# Patient Record
Sex: Female | Born: 1970 | Race: Black or African American | Hispanic: No | Marital: Single | Smoking: Former smoker
Health system: Southern US, Community
[De-identification: ages and names within clinical notes are randomized; demographics above are authoritative.]

## PROBLEM LIST (undated history)

## (undated) HISTORY — PX: HERNIA REPAIR: SHX51

## (undated) HISTORY — PX: MOUTH SURGERY: SHX715

## (undated) HISTORY — PX: UPPER GASTROINTESTINAL ENDOSCOPY: SHX188

---

## 1997-07-06 ENCOUNTER — Ambulatory Visit (HOSPITAL_COMMUNITY): Admission: RE | Admit: 1997-07-06 | Discharge: 1997-07-06 | Payer: Self-pay | Admitting: *Deleted

## 1997-08-14 ENCOUNTER — Inpatient Hospital Stay (HOSPITAL_COMMUNITY): Admission: AD | Admit: 1997-08-14 | Discharge: 1997-08-14 | Payer: Self-pay | Admitting: *Deleted

## 1997-09-06 ENCOUNTER — Encounter: Admission: RE | Admit: 1997-09-06 | Discharge: 1997-09-06 | Payer: Self-pay | Admitting: Sports Medicine

## 1997-09-12 ENCOUNTER — Encounter: Admission: RE | Admit: 1997-09-12 | Discharge: 1997-09-12 | Payer: Self-pay | Admitting: Family Medicine

## 1997-09-20 ENCOUNTER — Encounter: Admission: RE | Admit: 1997-09-20 | Discharge: 1997-09-20 | Payer: Self-pay | Admitting: Family Medicine

## 1997-09-27 ENCOUNTER — Encounter: Admission: RE | Admit: 1997-09-27 | Discharge: 1997-09-27 | Payer: Self-pay | Admitting: Sports Medicine

## 1997-09-27 ENCOUNTER — Inpatient Hospital Stay (HOSPITAL_COMMUNITY): Admission: AD | Admit: 1997-09-27 | Discharge: 1997-09-28 | Payer: Self-pay | Admitting: Obstetrics

## 1997-11-07 ENCOUNTER — Encounter: Admission: RE | Admit: 1997-11-07 | Discharge: 1997-11-07 | Payer: Self-pay | Admitting: Family Medicine

## 1998-12-24 ENCOUNTER — Encounter: Admission: RE | Admit: 1998-12-24 | Discharge: 1998-12-24 | Payer: Self-pay | Admitting: Family Medicine

## 1999-01-30 ENCOUNTER — Encounter: Admission: RE | Admit: 1999-01-30 | Discharge: 1999-01-30 | Payer: Self-pay | Admitting: Family Medicine

## 1999-07-29 ENCOUNTER — Encounter: Admission: RE | Admit: 1999-07-29 | Discharge: 1999-07-29 | Payer: Self-pay | Admitting: Sports Medicine

## 2000-03-11 ENCOUNTER — Other Ambulatory Visit: Admission: RE | Admit: 2000-03-11 | Discharge: 2000-03-11 | Payer: Self-pay | Admitting: *Deleted

## 2000-03-11 ENCOUNTER — Encounter: Admission: RE | Admit: 2000-03-11 | Discharge: 2000-03-11 | Payer: Self-pay | Admitting: Family Medicine

## 2000-06-28 ENCOUNTER — Emergency Department (HOSPITAL_COMMUNITY): Admission: EM | Admit: 2000-06-28 | Discharge: 2000-06-28 | Payer: Self-pay | Admitting: Emergency Medicine

## 2000-07-12 ENCOUNTER — Encounter: Payer: Self-pay | Admitting: Oral Surgery

## 2000-07-12 ENCOUNTER — Inpatient Hospital Stay (HOSPITAL_COMMUNITY): Admission: EM | Admit: 2000-07-12 | Discharge: 2000-07-14 | Payer: Self-pay | Admitting: *Deleted

## 2001-04-27 ENCOUNTER — Encounter: Admission: RE | Admit: 2001-04-27 | Discharge: 2001-04-27 | Payer: Self-pay | Admitting: Family Medicine

## 2001-09-29 ENCOUNTER — Encounter: Admission: RE | Admit: 2001-09-29 | Discharge: 2001-09-29 | Payer: Self-pay | Admitting: Family Medicine

## 2001-09-29 ENCOUNTER — Other Ambulatory Visit: Admission: RE | Admit: 2001-09-29 | Discharge: 2001-09-29 | Payer: Self-pay | Admitting: Family Medicine

## 2003-09-21 ENCOUNTER — Emergency Department (HOSPITAL_COMMUNITY): Admission: EM | Admit: 2003-09-21 | Discharge: 2003-09-21 | Payer: Self-pay | Admitting: Emergency Medicine

## 2004-05-28 ENCOUNTER — Other Ambulatory Visit: Admission: RE | Admit: 2004-05-28 | Discharge: 2004-05-28 | Payer: Self-pay | Admitting: Family Medicine

## 2004-05-28 ENCOUNTER — Ambulatory Visit: Payer: Self-pay | Admitting: Sports Medicine

## 2004-07-18 ENCOUNTER — Emergency Department (HOSPITAL_COMMUNITY): Admission: EM | Admit: 2004-07-18 | Discharge: 2004-07-18 | Payer: Self-pay | Admitting: Emergency Medicine

## 2004-07-23 ENCOUNTER — Ambulatory Visit: Payer: Self-pay | Admitting: Family Medicine

## 2004-07-23 ENCOUNTER — Encounter: Admission: RE | Admit: 2004-07-23 | Discharge: 2004-07-23 | Payer: Self-pay | Admitting: Sports Medicine

## 2004-07-28 ENCOUNTER — Ambulatory Visit: Payer: Self-pay | Admitting: Sports Medicine

## 2004-12-30 ENCOUNTER — Encounter (INDEPENDENT_AMBULATORY_CARE_PROVIDER_SITE_OTHER): Payer: Self-pay | Admitting: *Deleted

## 2004-12-30 LAB — CONVERTED CEMR LAB

## 2005-01-01 ENCOUNTER — Other Ambulatory Visit: Admission: RE | Admit: 2005-01-01 | Discharge: 2005-01-01 | Payer: Self-pay | Admitting: Family Medicine

## 2005-01-01 ENCOUNTER — Ambulatory Visit: Payer: Self-pay | Admitting: Sports Medicine

## 2006-07-29 DIAGNOSIS — D179 Benign lipomatous neoplasm, unspecified: Secondary | ICD-10-CM | POA: Insufficient documentation

## 2006-07-30 ENCOUNTER — Encounter (INDEPENDENT_AMBULATORY_CARE_PROVIDER_SITE_OTHER): Payer: Self-pay | Admitting: *Deleted

## 2007-11-30 ENCOUNTER — Emergency Department (HOSPITAL_COMMUNITY): Admission: EM | Admit: 2007-11-30 | Discharge: 2007-11-30 | Payer: Self-pay | Admitting: Physician Assistant

## 2008-07-31 ENCOUNTER — Ambulatory Visit (HOSPITAL_COMMUNITY): Admission: RE | Admit: 2008-07-31 | Discharge: 2008-07-31 | Payer: Self-pay | Admitting: Obstetrics

## 2009-06-23 ENCOUNTER — Emergency Department (HOSPITAL_COMMUNITY): Admission: EM | Admit: 2009-06-23 | Discharge: 2009-06-23 | Payer: Self-pay | Admitting: Emergency Medicine

## 2010-01-20 ENCOUNTER — Ambulatory Visit (HOSPITAL_COMMUNITY): Admission: RE | Admit: 2010-01-20 | Discharge: 2010-01-20 | Payer: Self-pay | Admitting: Obstetrics

## 2010-06-22 ENCOUNTER — Encounter: Payer: Self-pay | Admitting: Obstetrics

## 2010-10-17 NOTE — Op Note (Signed)
[facility]  Patient:    Kayla Maldonado, Kayla Maldonado                         MRN: [id number] Proc. Date: 07/13/00 Adm. 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  OPERATION:  Extraction of multiple teeth, debridement of oral abscesses.  SURGEON:  Lovena Le, D.D.S.  ASSISTANT:  ANESTHESIA:  General anesthesia via nasotracheal intubation.  INDICATIONS:  Ms. Hedtke presented to [facility] through the emergency department early in the a.m. of July 12, 2000, complaining of oral swelling and pain.  Upon examination, she exhibited generalized purulent drainage about the dentition and generalized oral swelling. Consequently, oral debridement and extractions were necessary as well as IV antibiotics due to a fever of 102 F.  DESCRIPTION OF PROCEDURE:  The patient was brought to the operating room in stable preoperative condition and placed on the operating room table in the supine position.  Following successful induction and nasotracheal intubation, the patient was prepped and draped in the usual sterile fashion for a procedure of this type.  Initially the oral cavity was thoroughly irrigated and prepped.  An oropharyngeal throat pack was placed.  Approximately 12 cc of a 1% lidocaine solution with 1:100,000 epinephrine was infiltrated into the maxillary and mandibular buccal soft tissues. Next, teeth numbers 7, 8, 9, 10, 19, 22, 23, 24, 25, 26 and 27 were all surgically removed.  The associated soft tissues were debrided. The two sockets were curetted and the operative sites were then thoroughly irrigated and closed with 4-0 chromic gut sutures. This completed the procedure and the oropharyngeal  throat pack was removed and the patient was allowed to recover from general anesthesia. The patient was then transported to the post anesthesia care unit in satisfactory postoperative condition. DD:  07/13/00 TD:  07/13/00 Job: [id number]

## 2010-10-17 NOTE — Op Note (Signed)
[facility]  Patient:    Kayla Maldonado, Kayla Maldonado                         MRN: [id number] Proc. Date: 07/13/00 Adm. 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  POSTOPERATIVE DIAGNOSIS:  Generalized severe periodontal disease with  multiple periodontal abscesses.  PROCEDURE: 1. Extraction of multiple teeth. 2. Debridement of oral abscesses.  ANESTHESIA: General via nasal tracheal intubation.  SURGEON:  Lovena Le, D.D.S.  INDICATIONS:  [name] presented to [facility] through the emergency department early in the a.m. of July 12, 2000, complaining of oral swelling and pain.  Upon examination she exhibited generalized purulent drainage about the dentition and generalized oral swelling.  Consequently an oral debridement and extractions were necessary as well as DD:  07/13/00 TD:  07/13/00 Job: [id number]

## 2011-04-06 ENCOUNTER — Ambulatory Visit: Payer: Self-pay | Admitting: Family

## 2011-04-06 DIAGNOSIS — Z0289 Encounter for other administrative examinations: Secondary | ICD-10-CM

## 2011-07-02 ENCOUNTER — Other Ambulatory Visit: Payer: Self-pay | Admitting: Internal Medicine

## 2011-07-02 DIAGNOSIS — R1013 Epigastric pain: Secondary | ICD-10-CM

## 2011-07-07 ENCOUNTER — Ambulatory Visit
Admission: RE | Admit: 2011-07-07 | Discharge: 2011-07-07 | Disposition: A | Discharge status: Home / Self Care | Payer: BC Managed Care – PPO | Source: Ambulatory Visit | Attending: Internal Medicine | Admitting: Internal Medicine

## 2011-07-07 DIAGNOSIS — R1013 Epigastric pain: Secondary | ICD-10-CM

## 2011-11-10 ENCOUNTER — Other Ambulatory Visit: Payer: Self-pay | Admitting: Internal Medicine

## 2011-11-10 DIAGNOSIS — Z1231 Encounter for screening mammogram for malignant neoplasm of breast: Secondary | ICD-10-CM

## 2011-11-26 ENCOUNTER — Ambulatory Visit: Payer: BC Managed Care – PPO

## 2011-12-24 ENCOUNTER — Ambulatory Visit
Admission: RE | Admit: 2011-12-24 | Discharge: 2011-12-24 | Disposition: A | Discharge status: Home / Self Care | Payer: BC Managed Care – PPO | Source: Ambulatory Visit | Attending: Internal Medicine | Admitting: Internal Medicine

## 2011-12-24 DIAGNOSIS — Z1231 Encounter for screening mammogram for malignant neoplasm of breast: Secondary | ICD-10-CM

## 2012-06-05 ENCOUNTER — Encounter (HOSPITAL_BASED_OUTPATIENT_CLINIC_OR_DEPARTMENT_OTHER): Payer: Self-pay | Admitting: Emergency Medicine

## 2012-06-05 ENCOUNTER — Emergency Department (HOSPITAL_BASED_OUTPATIENT_CLINIC_OR_DEPARTMENT_OTHER)
Admission: EM | Admit: 2012-06-05 | Discharge: 2012-06-05 | Disposition: A | Discharge status: Home / Self Care | Payer: BC Managed Care – PPO | Attending: Emergency Medicine | Admitting: Emergency Medicine

## 2012-06-05 ENCOUNTER — Emergency Department (HOSPITAL_BASED_OUTPATIENT_CLINIC_OR_DEPARTMENT_OTHER): Payer: BC Managed Care – PPO

## 2012-06-05 DIAGNOSIS — B9789 Other viral agents as the cause of diseases classified elsewhere: Secondary | ICD-10-CM | POA: Insufficient documentation

## 2012-06-05 DIAGNOSIS — J3489 Other specified disorders of nose and nasal sinuses: Secondary | ICD-10-CM | POA: Insufficient documentation

## 2012-06-05 DIAGNOSIS — R059 Cough, unspecified: Secondary | ICD-10-CM | POA: Insufficient documentation

## 2012-06-05 DIAGNOSIS — R197 Diarrhea, unspecified: Secondary | ICD-10-CM | POA: Insufficient documentation

## 2012-06-05 DIAGNOSIS — R5381 Other malaise: Secondary | ICD-10-CM | POA: Insufficient documentation

## 2012-06-05 DIAGNOSIS — R52 Pain, unspecified: Secondary | ICD-10-CM | POA: Insufficient documentation

## 2012-06-05 DIAGNOSIS — R05 Cough: Secondary | ICD-10-CM | POA: Insufficient documentation

## 2012-06-05 DIAGNOSIS — B349 Viral infection, unspecified: Secondary | ICD-10-CM

## 2012-06-05 DIAGNOSIS — IMO0001 Reserved for inherently not codable concepts without codable children: Secondary | ICD-10-CM | POA: Insufficient documentation

## 2012-06-05 MED ORDER — IBUPROFEN 800 MG PO TABS
800.0000 mg | ORAL_TABLET | Freq: Once | ORAL | Status: AC
  Administered 2012-06-05: 800 mg via ORAL
  Filled 2012-06-05: qty 1

## 2012-06-05 NOTE — ED Notes (Addendum)
Mild productive cough since yesterday.  Generalized body aches, chills, fever.  Some diarrhea, no n/v. Hasn't taken any Tylenol or Motrin for fever/sx.  No known exposure to anyone with similar sx.  She has not had a flu vaccine this year.

## 2012-06-05 NOTE — ED Provider Notes (Signed)
History     CSN: [id number]  Arrival date & time 06/05/12  4098   First MD Initiated Contact with Patient 06/05/12 [phone]      Chief Complaint  Patient presents with  . Cough  . Fever  . Generalized Body Aches    (Consider location/radiation/quality/duration/timing/severity/associated sxs/prior treatment) HPI Comments: Patient presents with a two-day history of flulike symptoms. She states she's had a sore throat and some mild nasal congestion. She also feels congested in her chest and has had a slightly productive cough. She sore all over including sore across her chest and her abdomen from coughing. She is also sore in her extremities. She denies any neck pain or stiffness. Denies any photophobia. She has a mild headache. She's had some loose stools but no vomiting or diarrhea. She's had fevers but has not taken anything at home for fever.  Patient is a 42 y.o. female presenting with cough and fever.  Cough Associated symptoms include rhinorrhea and myalgias. Pertinent negatives include no chest pain, no chills, no headaches and no shortness of breath.  Fever Primary symptoms of the febrile illness include fever, fatigue, cough, diarrhea and myalgias. Primary symptoms do not include headaches, shortness of breath, abdominal pain, nausea, vomiting, arthralgias or rash.  The myalgias are not associated with weakness.    History reviewed. No pertinent past medical history.  History reviewed. No pertinent past surgical history.  No family history on file.  History  Substance Use Topics  . Smoking status: Never Smoker   . Smokeless tobacco: Not on file  . Alcohol Use: Yes     Comment: occasionally    OB History    Grav Para Term Preterm Abortions TAB SAB Ect Mult Living                  Review of Systems  Constitutional: Positive for fever and fatigue. Negative for chills and diaphoresis.  HENT: Positive for congestion and rhinorrhea. Negative for sneezing.   Eyes:  Negative.   Respiratory: Positive for cough. Negative for chest tightness and shortness of breath.   Cardiovascular: Negative for chest pain and leg swelling.  Gastrointestinal: Positive for diarrhea. Negative for nausea, vomiting, abdominal pain and blood in stool.  Genitourinary: Negative for frequency, hematuria, flank pain and difficulty urinating.  Musculoskeletal: Positive for myalgias. Negative for back pain and arthralgias.  Skin: Negative for rash.  Neurological: Negative for dizziness, speech difficulty, weakness, numbness and headaches.    Allergies  Codeine  Home Medications  No current outpatient prescriptions on file.  BP 121/92  Pulse 109  Temp 101 F (38.3 C) (Oral)  Resp 18  Ht 5\' 5"  (1.651 m)  Wt 140 lb (63.504 kg)  BMI 23.30 kg/m2  SpO2 100%  LMP 06/05/2012  Physical Exam  Constitutional: She is oriented to person, place, and time. She appears well-developed and well-nourished.  HENT:  Head: Normocephalic and atraumatic.  Right Ear: External ear normal.  Left Ear: External ear normal.  Mouth/Throat: Oropharynx is clear and moist.  Eyes: Pupils are equal, round, and reactive to light.  Neck: Normal range of motion. Neck supple.       No meningeal signs  Cardiovascular: Normal rate, regular rhythm and normal heart sounds.   Pulmonary/Chest: Effort normal and breath sounds normal. No respiratory distress. She has no wheezes. She has no rales. She exhibits no tenderness.  Abdominal: Soft. Bowel sounds are normal. There is no tenderness. There is no rebound and no guarding.  Musculoskeletal: Normal  range of motion. She exhibits no edema.  Lymphadenopathy:    She has no cervical adenopathy.  Neurological: She is alert and oriented to person, place, and time.  Skin: Skin is warm and dry. No rash noted.  Psychiatric: She has a normal mood and affect.    ED Course  Procedures (including critical care time)  Results for orders placed during the hospital  encounter of 06/05/12  RAPID STREP SCREEN      Component Value Range   Streptococcus, Group A Screen (Direct) NEGATIVE  NEGATIVE   Dg Chest 2 View  06/05/2012  *RADIOLOGY REPORT*  Clinical Data: 42 year old with cough and fever.  CHEST - 2 VIEW  Comparison: 09/21/2003  Findings: Two views of the chest demonstrate clear lungs. Heart and mediastinum are within normal limits.  The trachea is midline.  The bony thorax is intact.  IMPRESSION: No acute cardiopulmonary disease.   Original Report Authenticated By: Richarda Overlie, M.D.       1. Viral syndrome       MDM  Patient is well-appearing with a febrile illness and upper respiratory symptoms. This is likely an upper respiratory virus. She has nothing to suggest meningitis. She did not have an ongoing cough. There is no evidence of pneumonia. Her strep test is negative. Advised her to use Motrin or Tylenol for symptomatic relief and follow up with her primary care physician or return here as needed for any ongoing or worsening symptoms.        Rolan Bucco, MD 06/05/12 1140

## 2012-06-05 NOTE — ED Notes (Signed)
Also c/o bilateral flank pain since this am.  Describes as sharp.  Right flank pain radiates to right lower rib area.

## 2012-09-05 ENCOUNTER — Ambulatory Visit: Payer: Self-pay | Admitting: Obstetrics

## 2012-09-05 ENCOUNTER — Ambulatory Visit (INDEPENDENT_AMBULATORY_CARE_PROVIDER_SITE_OTHER): Payer: BC Managed Care – PPO | Admitting: Obstetrics

## 2012-09-05 ENCOUNTER — Encounter: Payer: Self-pay | Admitting: Obstetrics

## 2012-09-05 VITALS — BP 134/87 | HR 81 | Temp 98.6°F | Ht 65.0 in | Wt 150.0 lb

## 2012-09-05 DIAGNOSIS — Z113 Encounter for screening for infections with a predominantly sexual mode of transmission: Secondary | ICD-10-CM

## 2012-09-05 DIAGNOSIS — Z01419 Encounter for gynecological examination (general) (routine) without abnormal findings: Secondary | ICD-10-CM

## 2012-09-05 NOTE — Progress Notes (Signed)
Subjective:     Kayla Maldonado is a 42 y.o. female here for a routine exam.  Current complaints: annual exam- no problems noted.   Gynecologic History Patient's last menstrual period was 08/26/2012. Contraception: condoms Last Pap: 1 year. Results were: normal Last [facility]: [facility] Results were: normal  Obstetric History OB History   Grav Para Term Preterm Abortions TAB SAB Ect Mult Living                   The following portions of the patient's history were reviewed and updated as appropriate: allergies, current medications, past family history, past medical history, past social history, past surgical history and problem list.  Review of Systems A comprehensive review of systems was negative.    Objective:    General appearance: alert and no distress    Breasts:  No masses, tenderness or nipple discharge.  Abdomen:  Soft, NT.  Pelvic:  NEFG.  Vagina, cervix, uterus and adnexa normal.  Assessment:    Healthy female exam.    Plan:    Follow up in: 1 year.

## 2012-09-05 NOTE — Addendum Note (Signed)
Addended by: Julaine Hua on: 09/05/2012 03:59 PM   Modules accepted: Orders

## 2012-09-06 LAB — GC/CHLAMYDIA PROBE AMP: CT Probe RNA: NEGATIVE

## 2012-09-07 LAB — PAP IG W/ RFLX HPV ASCU

## 2012-09-08 ENCOUNTER — Other Ambulatory Visit: Payer: BC Managed Care – PPO | Admitting: *Deleted

## 2012-09-08 LAB — LIPID PANEL
HDL: 82 mg/dL (ref >39)
LDL Cholesterol: 67 mg/dL (ref 0–99)

## 2012-09-08 LAB — CBC
HCT: 36.8 % (ref 36.0–46.0)
Platelets: 333 10*3/uL (ref 150–400)
RBC: 4.45 MIL/uL (ref 3.87–5.11)
RDW: 14.4 % (ref 11.5–15.5)
WBC: 4.4 10*3/uL (ref 4.0–10.5)

## 2012-09-08 LAB — COMPREHENSIVE METABOLIC PANEL
ALT: 12 U/L (ref 0–35)
AST: 17 U/L (ref 0–37)
Albumin: 4.2 g/dL (ref 3.5–5.2)
Calcium: 9 mg/dL (ref 8.4–10.5)
Chloride: 104 mEq/L (ref 96–112)
Potassium: 4.4 mEq/L (ref 3.5–5.3)

## 2012-10-06 ENCOUNTER — Ambulatory Visit: Payer: Self-pay | Admitting: Obstetrics

## 2013-01-10 ENCOUNTER — Other Ambulatory Visit: Payer: Self-pay

## 2013-01-10 DIAGNOSIS — Z1231 Encounter for screening mammogram for malignant neoplasm of breast: Secondary | ICD-10-CM

## 2013-01-19 ENCOUNTER — Ambulatory Visit: Payer: BC Managed Care – PPO

## 2013-01-20 ENCOUNTER — Other Ambulatory Visit: Payer: Self-pay | Admitting: *Deleted

## 2013-01-20 MED ORDER — FLUCONAZOLE 150 MG PO TABS: 150.0000 mg | ORAL_TABLET | Freq: Once | ORAL | Status: DC

## 2013-01-20 MED ORDER — METRONIDAZOLE 500 MG PO TABS: 500.0000 mg | ORAL_TABLET | Freq: Two times a day (BID) | ORAL | Status: DC

## 2013-01-24 ENCOUNTER — Encounter: Payer: Self-pay | Admitting: Obstetrics & Gynecology

## 2013-02-03 ENCOUNTER — Ambulatory Visit
Admission: RE | Admit: 2013-02-03 | Discharge: 2013-02-03 | Disposition: A | Discharge status: Home / Self Care | Payer: BC Managed Care – PPO | Source: Ambulatory Visit

## 2013-02-03 DIAGNOSIS — Z1231 Encounter for screening mammogram for malignant neoplasm of breast: Secondary | ICD-10-CM

## 2013-04-06 ENCOUNTER — Other Ambulatory Visit: Payer: Self-pay

## 2013-10-12 ENCOUNTER — Encounter: Payer: Self-pay | Admitting: Obstetrics

## 2013-10-12 ENCOUNTER — Ambulatory Visit (INDEPENDENT_AMBULATORY_CARE_PROVIDER_SITE_OTHER): Payer: BC Managed Care – PPO | Admitting: Obstetrics

## 2013-10-12 VITALS — BP 127/84 | HR 81 | Temp 98.6°F | Ht 65.0 in

## 2013-10-12 DIAGNOSIS — Z01419 Encounter for gynecological examination (general) (routine) without abnormal findings: Secondary | ICD-10-CM

## 2013-10-12 DIAGNOSIS — Z113 Encounter for screening for infections with a predominantly sexual mode of transmission: Secondary | ICD-10-CM

## 2013-10-12 LAB — RPR

## 2013-10-12 NOTE — Addendum Note (Signed)
Addended by: Lewie Loron D on: 10/12/2013 03:32 PM   Modules accepted: Orders

## 2013-10-12 NOTE — Progress Notes (Signed)
Subjective:     Kayla Maldonado is a 43 y.o. female here for a routine exam.  Current complaints: None.    Personal health questionnaire:  Is patient Kayla Maldonado, have a family history of breast and/or ovarian cancer: no Is there a family history of uterine cancer diagnosed at age < 6, gastrointestinal cancer, urinary tract cancer, family member who is a Field seismologist syndrome-associated carrier: no Is the patient overweight and hypertensive, family history of diabetes, personal history of gestational diabetes or PCOS: no Is patient over 60, have PCOS,  family history of premature CHD under age 63, diabetes, smoke, have hypertension or peripheral artery disease:  no  The HPI was reviewed and explored in further detail by the provider. Gynecologic History Patient's last menstrual period was 10/03/2013. Contraception: none Last Pap: 2014. Results were: ASCUS Last mammogram: 2014. Results were: normal  Obstetric History OB History  Gravida Para Term Preterm AB SAB TAB Ectopic Multiple Living  4 4 4       4     # Outcome Date GA Lbr Len/2nd Weight Sex Delivery Anes PTL Lv  4 TRM 09/27/97    F SVD   Y  3 TRM 11/12/93    M SVD   Y  2 TRM 03/13/92    F SVD   Y  1 TRM 10/27/90    M SVD   Y      History reviewed. No pertinent past medical history.  History reviewed. No pertinent past surgical history.  No current outpatient prescriptions on file. Allergies  Allergen Reactions  . Codeine Itching    History  Substance Use Topics  . Smoking status: Former Smoker    Quit date: 06/01/1997  . Smokeless tobacco: Not on file  . Alcohol Use: Yes     Comment: occasionally    Family History  Problem Relation Age of Onset  . Diabetes Brother       Review of Systems  Constitutional: negative for fatigue and weight loss Respiratory: negative for cough and wheezing Cardiovascular: negative for chest pain, fatigue and palpitations Gastrointestinal: negative for abdominal pain and change in  bowel habits Musculoskeletal:negative for myalgias Neurological: negative for gait problems and tremors Behavioral/Psych: negative for abusive relationship, depression Endocrine: negative for temperature intolerance   Genitourinary:negative for abnormal menstrual periods, genital lesions, hot flashes, sexual problems and vaginal discharge Integument/breast: negative for breast lump, breast tenderness, nipple discharge and skin lesion(s)    Objective:       BP 127/84  Pulse 81  Temp(Src) 98.6 F (37 C)  Ht 5\' 5"  (1.651 m)  LMP 10/03/2013 General:   alert  Skin:   no rash or abnormalities  Lungs:   clear to auscultation bilaterally  Heart:   regular rate and rhythm, S1, S2 normal, no murmur, click, rub or gallop  Breasts:   normal without suspicious masses, skin or nipple changes or axillary nodes  Abdomen:  normal findings: no organomegaly, soft, non-tender and no hernia  Pelvis:  External genitalia: normal general appearance Urinary system: urethral meatus normal and bladder without fullness, nontender Vaginal: normal without tenderness, induration or masses Cervix: normal appearance Adnexa: normal bimanual exam Uterus: anteverted and non-tender, normal size   Lab Review Urine pregnancy test Labs reviewed yes Radiologic studies reviewed no    Assessment:    Healthy female exam.    Plan:    Education reviewed: calcium supplements, low fat, low cholesterol diet, safe sex/STD prevention, self breast exams and weight bearing exercise. Contraception:  none. Follow up in: 1 year.   No orders of the defined types were placed in this encounter.   Orders Placed This Encounter  Procedures  . HIV antibody  . Hepatitis B surface antigen  . RPR  . Hepatitis C antibody

## 2013-10-13 LAB — WET PREP BY MOLECULAR PROBE
CANDIDA SPECIES: NEGATIVE
GARDNERELLA VAGINALIS: NEGATIVE
TRICHOMONAS VAG: NEGATIVE

## 2013-10-13 LAB — PAP IG W/ RFLX HPV ASCU

## 2013-10-13 LAB — HIV ANTIBODY (ROUTINE TESTING W REFLEX): HIV: NONREACTIVE

## 2013-10-13 LAB — GC/CHLAMYDIA PROBE AMP
CT Probe RNA: NEGATIVE
GC PROBE AMP APTIMA: NEGATIVE

## 2013-10-13 LAB — HEPATITIS B SURFACE ANTIGEN: HEP B S AG: NEGATIVE

## 2013-10-13 LAB — HEPATITIS C ANTIBODY: HCV AB: NEGATIVE

## 2014-02-12 ENCOUNTER — Other Ambulatory Visit: Payer: Self-pay

## 2014-02-12 DIAGNOSIS — Z1231 Encounter for screening mammogram for malignant neoplasm of breast: Secondary | ICD-10-CM

## 2014-02-21 ENCOUNTER — Ambulatory Visit
Admission: RE | Admit: 2014-02-21 | Discharge: 2014-02-21 | Disposition: A | Discharge status: Home / Self Care | Payer: BC Managed Care – PPO | Source: Ambulatory Visit

## 2014-02-21 DIAGNOSIS — Z1231 Encounter for screening mammogram for malignant neoplasm of breast: Secondary | ICD-10-CM

## 2014-03-16 ENCOUNTER — Other Ambulatory Visit: Payer: Self-pay

## 2014-04-02 ENCOUNTER — Encounter: Payer: Self-pay | Admitting: Obstetrics

## 2014-12-12 ENCOUNTER — Ambulatory Visit: Payer: BC Managed Care – PPO | Admitting: Obstetrics

## 2014-12-24 ENCOUNTER — Ambulatory Visit: Payer: BC Managed Care – PPO | Admitting: Obstetrics

## 2015-02-13 ENCOUNTER — Ambulatory Visit (INDEPENDENT_AMBULATORY_CARE_PROVIDER_SITE_OTHER): Payer: BC Managed Care – PPO | Admitting: Obstetrics

## 2015-02-13 ENCOUNTER — Encounter: Payer: Self-pay | Admitting: Obstetrics

## 2015-02-13 VITALS — BP 130/86 | HR 70 | Temp 98.4°F | Ht 65.0 in | Wt 162.0 lb

## 2015-02-13 DIAGNOSIS — N841 Polyp of cervix uteri: Secondary | ICD-10-CM

## 2015-02-13 DIAGNOSIS — Z01419 Encounter for gynecological examination (general) (routine) without abnormal findings: Secondary | ICD-10-CM

## 2015-02-13 DIAGNOSIS — Z1239 Encounter for other screening for malignant neoplasm of breast: Secondary | ICD-10-CM

## 2015-02-13 DIAGNOSIS — N930 Postcoital and contact bleeding: Secondary | ICD-10-CM

## 2015-02-13 DIAGNOSIS — Z113 Encounter for screening for infections with a predominantly sexual mode of transmission: Secondary | ICD-10-CM

## 2015-02-13 LAB — COMPREHENSIVE METABOLIC PANEL
ALK PHOS: 35 U/L (ref 33–115)
ALT: 10 U/L (ref 6–29)
AST: 15 U/L (ref 10–30)
Albumin: 4.1 g/dL (ref 3.6–5.1)
BILIRUBIN TOTAL: 0.6 mg/dL (ref 0.2–1.2)
BUN: 8 mg/dL (ref 7–25)
CO2: 24 mmol/L (ref 20–31)
CREATININE: 0.71 mg/dL (ref 0.50–1.10)
Calcium: 9.5 mg/dL (ref 8.6–10.2)
Chloride: 104 mmol/L (ref 98–110)
GLUCOSE: 73 mg/dL (ref 65–99)
Potassium: 4.6 mmol/L (ref 3.5–5.3)
SODIUM: 136 mmol/L (ref 135–146)
Total Protein: 6.8 g/dL (ref 6.1–8.1)

## 2015-02-13 LAB — CBC
HEMATOCRIT: 37.9 % (ref 36.0–46.0)
Hemoglobin: 12.7 g/dL (ref 12.0–15.0)
MCH: 27.5 pg (ref 26.0–34.0)
MCHC: 33.5 g/dL (ref 30.0–36.0)
MCV: 82 fL (ref 78.0–100.0)
MPV: 9.4 fL (ref 8.6–12.4)
PLATELETS: 353 10*3/uL (ref 150–400)
RBC: 4.62 MIL/uL (ref 3.87–5.11)
RDW: 14.5 % (ref 11.5–15.5)
WBC: 5.7 10*3/uL (ref 4.0–10.5)

## 2015-02-13 LAB — TSH: TSH: 1.192 u[IU]/mL (ref 0.350–4.500)

## 2015-02-13 NOTE — Addendum Note (Signed)
Addended by: Lewie Loron D on: 02/13/2015 05:04 PM   Modules accepted: Orders
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
## 2015-02-13 NOTE — Addendum Note (Signed)
Addended by: Baltazar Najjar A on: 02/13/2015 01:15 PM   Modules accepted: Level of Service

## 2015-02-14 LAB — RPR

## 2015-02-14 LAB — HEPATITIS B SURFACE ANTIGEN: HEP B S AG: NEGATIVE

## 2015-02-14 LAB — HIV ANTIBODY (ROUTINE TESTING W REFLEX): HIV: NONREACTIVE

## 2015-02-14 LAB — HEPATITIS C ANTIBODY: HCV AB: NEGATIVE

## 2015-02-15 LAB — PAP, TP IMAGING W/ HPV RNA, RFLX HPV TYPE 16,18/45: HPV mRNA, High Risk: NOT DETECTED

## 2015-02-17 LAB — SURESWAB, VAGINOSIS/VAGINITIS PLUS
Atopobium vaginae: NOT DETECTED Log (cells/mL)
BV CATEGORY: UNDETERMINED — AB
C. ALBICANS, DNA: NOT DETECTED
C. GLABRATA, DNA: NOT DETECTED
C. PARAPSILOSIS, DNA: NOT DETECTED
C. TRACHOMATIS RNA, TMA: NOT DETECTED
C. TROPICALIS, DNA: NOT DETECTED
GARDNERELLA VAGINALIS: 7 Log (cells/mL)
LACTOBACILLUS SPECIES: 7.5 Log (cells/mL)
MEGASPHAERA SPECIES: NOT DETECTED Log (cells/mL)
N. gonorrhoeae RNA, TMA: NOT DETECTED
T. VAGINALIS RNA, QL TMA: NOT DETECTED

## 2015-02-18 ENCOUNTER — Other Ambulatory Visit: Payer: Self-pay | Admitting: Obstetrics

## 2015-02-18 DIAGNOSIS — N760 Acute vaginitis: Principal | ICD-10-CM

## 2015-02-18 DIAGNOSIS — B373 Candidiasis of vulva and vagina: Secondary | ICD-10-CM

## 2015-02-18 DIAGNOSIS — B3731 Acute candidiasis of vulva and vagina: Secondary | ICD-10-CM

## 2015-02-18 DIAGNOSIS — B9689 Other specified bacterial agents as the cause of diseases classified elsewhere: Secondary | ICD-10-CM

## 2015-02-18 MED ORDER — METRONIDAZOLE 500 MG PO TABS: 500.0000 mg | ORAL_TABLET | Freq: Two times a day (BID) | ORAL | Status: DC

## 2015-02-18 MED ORDER — FLUCONAZOLE 150 MG PO TABS: 150.0000 mg | ORAL_TABLET | Freq: Once | ORAL | Status: DC

## 2015-04-15 ENCOUNTER — Encounter (HOSPITAL_BASED_OUTPATIENT_CLINIC_OR_DEPARTMENT_OTHER): Payer: Self-pay | Admitting: *Deleted

## 2015-04-15 ENCOUNTER — Emergency Department (HOSPITAL_BASED_OUTPATIENT_CLINIC_OR_DEPARTMENT_OTHER): Payer: BC Managed Care – PPO

## 2015-04-15 ENCOUNTER — Emergency Department (HOSPITAL_BASED_OUTPATIENT_CLINIC_OR_DEPARTMENT_OTHER)
Admission: EM | Admit: 2015-04-15 | Discharge: 2015-04-15 | Disposition: A | Discharge status: Home / Self Care | Payer: BC Managed Care – PPO | Attending: Emergency Medicine | Admitting: Emergency Medicine

## 2015-04-15 DIAGNOSIS — Y92410 Unspecified street and highway as the place of occurrence of the external cause: Secondary | ICD-10-CM | POA: Diagnosis not present

## 2015-04-15 DIAGNOSIS — S199XXA Unspecified injury of neck, initial encounter: Secondary | ICD-10-CM | POA: Diagnosis not present

## 2015-04-15 DIAGNOSIS — Y9389 Activity, other specified: Secondary | ICD-10-CM | POA: Diagnosis not present

## 2015-04-15 DIAGNOSIS — S3991XD Unspecified injury of abdomen, subsequent encounter: Secondary | ICD-10-CM | POA: Insufficient documentation

## 2015-04-15 DIAGNOSIS — Y998 Other external cause status: Secondary | ICD-10-CM | POA: Diagnosis not present

## 2015-04-15 DIAGNOSIS — T148XXA Other injury of unspecified body region, initial encounter: Secondary | ICD-10-CM

## 2015-04-15 DIAGNOSIS — R1084 Generalized abdominal pain: Secondary | ICD-10-CM

## 2015-04-15 DIAGNOSIS — R109 Unspecified abdominal pain: Secondary | ICD-10-CM

## 2015-04-15 DIAGNOSIS — Z87891 Personal history of nicotine dependence: Secondary | ICD-10-CM | POA: Diagnosis not present

## 2015-04-15 DIAGNOSIS — Z3202 Encounter for pregnancy test, result negative: Secondary | ICD-10-CM | POA: Diagnosis not present

## 2015-04-15 DIAGNOSIS — Z79899 Other long term (current) drug therapy: Secondary | ICD-10-CM | POA: Insufficient documentation

## 2015-04-15 DIAGNOSIS — T148 Other injury of unspecified body region: Secondary | ICD-10-CM | POA: Diagnosis not present

## 2015-04-15 LAB — URINALYSIS, ROUTINE W REFLEX MICROSCOPIC
BILIRUBIN URINE: NEGATIVE
GLUCOSE, UA: NEGATIVE mg/dL
HGB URINE DIPSTICK: NEGATIVE
Ketones, ur: NEGATIVE mg/dL
Leukocytes, UA: NEGATIVE
NITRITE: NEGATIVE
PH: 6 (ref 5.0–8.0)
Protein, ur: NEGATIVE mg/dL
SPECIFIC GRAVITY, URINE: 1.023 (ref 1.005–1.030)
Urobilinogen, UA: 1 mg/dL (ref 0.0–1.0)

## 2015-04-15 LAB — CBC WITH DIFFERENTIAL/PLATELET
Basophils Absolute: 0 10*3/uL (ref 0.0–0.1)
Basophils Relative: 0 %
EOS PCT: 1 %
Eosinophils Absolute: 0 10*3/uL (ref 0.0–0.7)
HCT: 40 % (ref 36.0–46.0)
Hemoglobin: 13.3 g/dL (ref 12.0–15.0)
LYMPHS ABS: 2.8 10*3/uL (ref 0.7–4.0)
LYMPHS PCT: 62 %
MCH: 27.7 pg (ref 26.0–34.0)
MCHC: 33.3 g/dL (ref 30.0–36.0)
MCV: 83.3 fL (ref 78.0–100.0)
Monocytes Absolute: 0.3 10*3/uL (ref 0.1–1.0)
Monocytes Relative: 7 %
Neutro Abs: 1.4 10*3/uL — ABNORMAL LOW (ref 1.7–7.7)
Neutrophils Relative %: 30 %
PLATELETS: 401 10*3/uL — AB (ref 150–400)
RBC: 4.8 MIL/uL (ref 3.87–5.11)
RDW: 13.7 % (ref 11.5–15.5)
WBC: 4.6 10*3/uL (ref 4.0–10.5)

## 2015-04-15 LAB — COMPREHENSIVE METABOLIC PANEL
ALK PHOS: 45 U/L (ref 38–126)
ALT: 15 U/L (ref 14–54)
ANION GAP: 5 (ref 5–15)
AST: 24 U/L (ref 15–41)
Albumin: 4.1 g/dL (ref 3.5–5.0)
BILIRUBIN TOTAL: 0.5 mg/dL (ref 0.3–1.2)
BUN: 12 mg/dL (ref 6–20)
CALCIUM: 9.1 mg/dL (ref 8.9–10.3)
CO2: 26 mmol/L (ref 22–32)
Chloride: 106 mmol/L (ref 101–111)
Creatinine, Ser: 0.67 mg/dL (ref 0.44–1.00)
Glucose, Bld: 102 mg/dL — ABNORMAL HIGH (ref 65–99)
Potassium: 4 mmol/L (ref 3.5–5.1)
Sodium: 137 mmol/L (ref 135–145)
TOTAL PROTEIN: 7.7 g/dL (ref 6.5–8.1)

## 2015-04-15 LAB — PREGNANCY, URINE: Preg Test, Ur: NEGATIVE

## 2015-04-15 MED ORDER — IOHEXOL 300 MG/ML  SOLN: 100.0000 mL | Freq: Once | INTRAMUSCULAR | Status: DC | PRN

## 2015-04-15 MED ORDER — IOHEXOL 300 MG/ML  SOLN
100.0000 mL | Freq: Once | INTRAMUSCULAR | Status: AC | PRN
  Administered 2015-04-15: 100 mL via INTRAVENOUS

## 2015-04-15 MED ORDER — TRAMADOL HCL 50 MG PO TABS: 50.0000 mg | ORAL_TABLET | Freq: Four times a day (QID) | ORAL | Status: DC | PRN

## 2015-04-15 MED ORDER — CYCLOBENZAPRINE HCL 5 MG PO TABS: 5.0000 mg | ORAL_TABLET | Freq: Two times a day (BID) | ORAL | Status: DC | PRN
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
## 2015-04-15 NOTE — ED Notes (Signed)
MD at bedside. 
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
## 2015-04-15 NOTE — ED Notes (Signed)
Patient transported to and from [facility].

## 2015-04-15 NOTE — ED Notes (Signed)
States seeked medical care at an ED in [place]. At [facility] area.

## 2015-04-15 NOTE — Discharge Instructions (Signed)
Abdominal Pain, Adult Many things can cause abdominal pain. Usually, abdominal pain is not caused by a disease and will improve without treatment. It can often be observed and treated at home. Your health care provider will do a physical exam and possibly order blood tests and X-rays to help determine the seriousness of your pain. However, in many cases, more time must pass before a clear cause of the pain can be found. Before that point, your health care provider may not know if you need more testing or further treatment. HOME CARE INSTRUCTIONS Monitor your abdominal pain for any changes. The following actions may help to alleviate any discomfort you are experiencing:  Only take over-the-counter or prescription medicines as directed by your health care provider.  Do not take laxatives unless directed to do so by your health care provider.  Try a clear liquid diet (broth, tea, or water) as directed by your health care provider. Slowly move to a bland diet as tolerated. SEEK MEDICAL CARE IF:  You have unexplained abdominal pain.  You have abdominal pain associated with nausea or diarrhea.  You have pain when you urinate or have a bowel movement.  You experience abdominal pain that wakes you in the night.  You have abdominal pain that is worsened or improved by eating food.  You have abdominal pain that is worsened with eating fatty foods.  You have a fever. SEEK IMMEDIATE MEDICAL CARE IF:  Your pain does not go away within 2 hours.  You keep throwing up (vomiting).  Your pain is felt only in portions of the abdomen, such as the right side or the left lower portion of the abdomen.  You pass bloody or black tarry stools. MAKE SURE YOU:  Understand these instructions.  Will watch your condition.  Will get help right away if you are not doing well or get worse.   This information is not intended to replace advice given to you by your health care provider. Make sure you discuss  any questions you have with your health care provider.   Document Released: 02/25/2005 Document Revised: 02/06/2015 Document Reviewed: 01/25/2013 Elsevier Interactive Patient Education 2016 [employer].  Technical brewer After a car crash (motor vehicle collision), it is normal to have bruises and sore muscles. The first 24 hours usually feel the worst. After that, you will likely start to feel better each day. HOME CARE  Put ice on the injured area.  Put ice in a plastic bag.  Place a towel between your skin and the bag.  Leave the ice on for 15-20 minutes, 03-04 times a day.  Drink enough fluids to keep your pee (urine) clear or pale yellow.  Do not drink alcohol.  Take a warm shower or bath 1 or 2 times a day. This helps your sore muscles.  Return to activities as told by your doctor. Be careful when lifting. Lifting can make neck or back pain worse.  Only take medicine as told by your doctor. Do not use aspirin. GET HELP RIGHT AWAY IF:   Your arms or legs tingle, feel weak, or lose feeling (numbness).  You have headaches that do not get better with medicine.  You have neck pain, especially in the middle of the back of your neck.  You cannot control when you pee (urinate) or poop (bowel movement).  Pain is getting worse in any part of your body.  You are short of breath, dizzy, or pass out (faint).  You have chest  pain.  You feel sick to your stomach (nauseous), throw up (vomit), or sweat.  You have belly (abdominal) pain that gets worse.  There is blood in your pee, poop, or throw up.  You have pain in your shoulder (shoulder strap areas).  Your problems are getting worse. MAKE SURE YOU:   Understand these instructions.  Will watch your condition.  Will get help right away if you are not doing well or get worse.   This information is not intended to replace advice given to you by your health care provider. Make sure you discuss any questions you  have with your health care provider.   Document Released: 11/04/2007 Document Revised: 08/10/2011 Document Reviewed: 10/15/2010 Elsevier Interactive Patient Education [employer].

## 2015-05-02 ENCOUNTER — Ambulatory Visit
Admission: RE | Admit: 2015-05-02 | Discharge: 2015-05-02 | Disposition: A | Discharge status: Home / Self Care | Payer: BC Managed Care – PPO | Source: Ambulatory Visit | Attending: Obstetrics | Admitting: Obstetrics

## 2015-05-02 DIAGNOSIS — Z1239 Encounter for other screening for malignant neoplasm of breast: Secondary | ICD-10-CM

## 2015-06-06 ENCOUNTER — Telehealth: Payer: Self-pay | Admitting: *Deleted

## 2015-06-06 NOTE — Telephone Encounter (Signed)
Patient contacted the office stating she had discussed possibly having a cervical polyp removed when she was here for her annual exam. Patient contacted the office to discuss that. Attempted to contact the patient and left message for patient to call the office.

## 2015-06-07 ENCOUNTER — Other Ambulatory Visit: Payer: Self-pay | Admitting: *Deleted

## 2015-06-10 ENCOUNTER — Encounter (HOSPITAL_COMMUNITY): Payer: Self-pay

## 2015-06-14 ENCOUNTER — Ambulatory Visit (HOSPITAL_COMMUNITY): Payer: BC Managed Care – PPO | Admitting: Anesthesiology

## 2015-06-14 ENCOUNTER — Encounter (HOSPITAL_COMMUNITY): Payer: Self-pay | Admitting: *Deleted

## 2015-06-14 ENCOUNTER — Other Ambulatory Visit: Payer: Self-pay | Admitting: Obstetrics

## 2015-06-14 ENCOUNTER — Encounter (HOSPITAL_COMMUNITY)
Admission: RE | Disposition: A | Discharge status: Home / Self Care | Payer: Self-pay | Source: Ambulatory Visit | Attending: Obstetrics

## 2015-06-14 ENCOUNTER — Ambulatory Visit (HOSPITAL_COMMUNITY)
Admission: RE | Admit: 2015-06-14 | Discharge: 2015-06-14 | Disposition: A | Discharge status: Home / Self Care | Payer: BC Managed Care – PPO | Source: Ambulatory Visit | Attending: Obstetrics | Admitting: Obstetrics

## 2015-06-14 DIAGNOSIS — N841 Polyp of cervix uteri: Secondary | ICD-10-CM | POA: Diagnosis not present

## 2015-06-14 DIAGNOSIS — G8918 Other acute postprocedural pain: Secondary | ICD-10-CM

## 2015-06-14 DIAGNOSIS — Z87891 Personal history of nicotine dependence: Secondary | ICD-10-CM | POA: Insufficient documentation

## 2015-06-14 HISTORY — PX: DILATATION & CURRETTAGE/HYSTEROSCOPY WITH RESECTOCOPE: SHX5572

## 2015-06-14 LAB — CBC
HEMATOCRIT: 40.2 % (ref 36.0–46.0)
Hemoglobin: 13.8 g/dL (ref 12.0–15.0)
MCH: 28.2 pg (ref 26.0–34.0)
MCHC: 34.3 g/dL (ref 30.0–36.0)
MCV: 82.2 fL (ref 78.0–100.0)
PLATELETS: 367 10*3/uL (ref 150–400)
RBC: 4.89 MIL/uL (ref 3.87–5.11)
RDW: 13.6 % (ref 11.5–15.5)
WBC: 5.2 10*3/uL (ref 4.0–10.5)

## 2015-06-14 LAB — PREGNANCY, URINE: Preg Test, Ur: NEGATIVE

## 2015-06-14 SURGERY — DILATATION & CURETTAGE/HYSTEROSCOPY WITH RESECTOCOPE
Anesthesia: General | Site: Vagina

## 2015-06-14 MED ORDER — TRAMADOL HCL 50 MG PO TABS: 50.0000 mg | ORAL_TABLET | Freq: Four times a day (QID) | ORAL | Status: DC | PRN

## 2015-06-14 MED ORDER — PROPOFOL 10 MG/ML IV BOLUS
INTRAVENOUS | Status: DC | PRN
  Administered 2015-06-14: 150 mg via INTRAVENOUS

## 2015-06-14 MED ORDER — MIDAZOLAM HCL 2 MG/2ML IJ SOLN
INTRAMUSCULAR | Status: AC
  Filled 2015-06-14: qty 2

## 2015-06-14 MED ORDER — SODIUM CHLORIDE 0.9 % IJ SOLN: 3.0000 mL | Freq: Two times a day (BID) | INTRAMUSCULAR | Status: DC

## 2015-06-14 MED ORDER — FENTANYL CITRATE (PF) 100 MCG/2ML IJ SOLN: 25.0000 ug | INTRAMUSCULAR | Status: DC | PRN

## 2015-06-14 MED ORDER — DEXAMETHASONE SODIUM PHOSPHATE 4 MG/ML IJ SOLN
INTRAMUSCULAR | Status: AC
  Filled 2015-06-14: qty 1

## 2015-06-14 MED ORDER — SODIUM CHLORIDE 0.9 % IV SOLN: 250.0000 mL | INTRAVENOUS | Status: DC | PRN

## 2015-06-14 MED ORDER — KETOROLAC TROMETHAMINE 30 MG/ML IJ SOLN
INTRAMUSCULAR | Status: DC | PRN
  Administered 2015-06-14: 30 mg via INTRAVENOUS

## 2015-06-14 MED ORDER — SCOPOLAMINE 1 MG/3DAYS TD PT72
MEDICATED_PATCH | TRANSDERMAL | Status: AC
  Administered 2015-06-14: 1.5 mg via TRANSDERMAL
  Filled 2015-06-14: qty 1

## 2015-06-14 MED ORDER — LACTATED RINGERS IV SOLN
INTRAVENOUS | Status: DC
  Administered 2015-06-14 (×2): via INTRAVENOUS

## 2015-06-14 MED ORDER — SODIUM CHLORIDE 0.9 % IJ SOLN: 3.0000 mL | INTRAMUSCULAR | Status: DC | PRN

## 2015-06-14 MED ORDER — KETOROLAC TROMETHAMINE 30 MG/ML IJ SOLN: 30.0000 mg | Freq: Four times a day (QID) | INTRAMUSCULAR | Status: DC

## 2015-06-14 MED ORDER — DEXAMETHASONE SODIUM PHOSPHATE 4 MG/ML IJ SOLN
INTRAMUSCULAR | Status: DC | PRN
  Administered 2015-06-14: 4 mg via INTRAVENOUS

## 2015-06-14 MED ORDER — MEPERIDINE HCL 25 MG/ML IJ SOLN: 6.2500 mg | INTRAMUSCULAR | Status: DC | PRN

## 2015-06-14 MED ORDER — ACETAMINOPHEN 650 MG RE SUPP
650.0000 mg | RECTAL | Status: DC | PRN
Start: 2015-06-14 — End: 2015-06-14
  Filled 2015-06-14: qty 1

## 2015-06-14 MED ORDER — ONDANSETRON HCL 4 MG/2ML IJ SOLN
INTRAMUSCULAR | Status: DC | PRN
  Administered 2015-06-14: 4 mg via INTRAVENOUS

## 2015-06-14 MED ORDER — IBUPROFEN 800 MG PO TABS: 800.0000 mg | ORAL_TABLET | Freq: Three times a day (TID) | ORAL | Status: DC | PRN

## 2015-06-14 MED ORDER — MIDAZOLAM HCL 5 MG/5ML IJ SOLN
INTRAMUSCULAR | Status: DC | PRN
  Administered 2015-06-14: 2 mg via INTRAVENOUS

## 2015-06-14 MED ORDER — KETOROLAC TROMETHAMINE 30 MG/ML IJ SOLN
INTRAMUSCULAR | Status: AC
  Filled 2015-06-14: qty 1

## 2015-06-14 MED ORDER — ONDANSETRON HCL 4 MG/2ML IJ SOLN
INTRAMUSCULAR | Status: AC
  Filled 2015-06-14: qty 2

## 2015-06-14 MED ORDER — FENTANYL CITRATE (PF) 100 MCG/2ML IJ SOLN
INTRAMUSCULAR | Status: DC | PRN
  Administered 2015-06-14: 50 ug via INTRAVENOUS

## 2015-06-14 MED ORDER — OXYCODONE HCL 5 MG PO TABS: 5.0000 mg | ORAL_TABLET | ORAL | Status: DC | PRN

## 2015-06-14 MED ORDER — LIDOCAINE HCL (CARDIAC) 20 MG/ML IV SOLN
INTRAVENOUS | Status: DC | PRN
  Administered 2015-06-14: 100 mg via INTRAVENOUS

## 2015-06-14 MED ORDER — FENTANYL CITRATE (PF) 100 MCG/2ML IJ SOLN
INTRAMUSCULAR | Status: AC
  Filled 2015-06-14: qty 2

## 2015-06-14 MED ORDER — LIDOCAINE HCL (CARDIAC) 20 MG/ML IV SOLN
INTRAVENOUS | Status: AC
  Filled 2015-06-14: qty 5

## 2015-06-14 MED ORDER — SODIUM BICARBONATE 8.4 % IV SOLN
INTRAVENOUS | Status: AC
  Filled 2015-06-14: qty 50

## 2015-06-14 MED ORDER — TRAMADOL HCL 50 MG PO TABS: 100.0000 mg | ORAL_TABLET | Freq: Four times a day (QID) | ORAL | Status: DC | PRN

## 2015-06-14 MED ORDER — SCOPOLAMINE 1 MG/3DAYS TD PT72
1.0000 | MEDICATED_PATCH | Freq: Once | TRANSDERMAL | Status: DC
  Administered 2015-06-14: 1.5 mg via TRANSDERMAL

## 2015-06-14 MED ORDER — HYDROCODONE-ACETAMINOPHEN 7.5-325 MG PO TABS: 1.0000 | ORAL_TABLET | Freq: Once | ORAL | Status: DC | PRN

## 2015-06-14 MED ORDER — PROPOFOL 10 MG/ML IV BOLUS
INTRAVENOUS | Status: AC
  Filled 2015-06-14: qty 20

## 2015-06-14 MED ORDER — LIDOCAINE HCL 1 % IJ SOLN
INTRAMUSCULAR | Status: AC
  Filled 2015-06-14: qty 20

## 2015-06-14 MED ORDER — ACETAMINOPHEN 325 MG PO TABS: 650.0000 mg | ORAL_TABLET | ORAL | Status: DC | PRN

## 2015-06-14 MED ORDER — METOCLOPRAMIDE HCL 5 MG/ML IJ SOLN: 10.0000 mg | Freq: Once | INTRAMUSCULAR | Status: DC | PRN

## 2015-06-14 SURGICAL SUPPLY — 25 items
ABLATOR ENDOMETRIAL BIPOLAR (ABLATOR) IMPLANT
CANISTER SUCT 3000ML (MISCELLANEOUS) ×2 IMPLANT
CATH FOLEY 2WAY SLVR 30CC 16FR (CATHETERS) IMPLANT
CATH ROBINSON RED A/P 16FR (CATHETERS) ×2 IMPLANT
CLOTH BEACON ORANGE TIMEOUT ST (SAFETY) ×2 IMPLANT
CONTAINER PREFILL 10% NBF 60ML (FORM) ×4 IMPLANT
CORD ACTIVE DISPOSABLE (ELECTRODE) ×1
CORD ELECTRO ACTIVE DISP (ELECTRODE) ×1 IMPLANT
ELECT BLADE 6.5 EXT (BLADE) ×1 IMPLANT
ELECT LOOP GYNE PRO 24FR (CUTTING LOOP)
ELECT REM PT RETURN 9FT ADLT (ELECTROSURGICAL) ×2
ELECT VAPORTRODE GRVD BAR (ELECTRODE) ×1 IMPLANT
ELECTRODE LOOP GYNE PRO 24FR (CUTTING LOOP) IMPLANT
ELECTRODE REM PT RTRN 9FT ADLT (ELECTROSURGICAL) ×1 IMPLANT
GLOVE BIO SURGEON STRL SZ8 (GLOVE) ×4 IMPLANT
GLOVE BIOGEL PI IND STRL 7.0 (GLOVE) ×1 IMPLANT
GLOVE BIOGEL PI INDICATOR 7.0 (GLOVE) ×1
GOWN STRL REUS W/TWL LRG LVL3 (GOWN DISPOSABLE) ×4 IMPLANT
NEEDLE HYPO 22GX1.5 SAFETY (NEEDLE) IMPLANT
PACK VAGINAL MINOR WOMEN LF (CUSTOM PROCEDURE TRAY) ×2 IMPLANT
PAD OB MATERNITY 4.3X12.25 (PERSONAL CARE ITEMS) ×2 IMPLANT
TOWEL OR 17X24 6PK STRL BLUE (TOWEL DISPOSABLE) ×4 IMPLANT
TUBING AQUILEX INFLOW (TUBING) ×2 IMPLANT
TUBING AQUILEX OUTFLOW (TUBING) ×2 IMPLANT
WATER STERILE IRR 1000ML POUR (IV SOLUTION) ×2 IMPLANT

## 2015-06-14 NOTE — Anesthesia Postprocedure Evaluation (Signed)
Anesthesia Post Note  Patient: Kayla Maldonado  Procedure(s) Performed: Procedure(s) (LRB): RESECTION OF CERVICAL POLYP (N/A)  Patient location during evaluation: PACU Anesthesia Type: General Level of consciousness: awake and alert and oriented Pain management: pain level controlled Vital Signs Assessment: post-procedure vital signs reviewed and stable Respiratory status: spontaneous breathing, nonlabored ventilation, respiratory function stable and patient connected to nasal cannula oxygen Cardiovascular status: blood pressure returned to baseline and stable Postop Assessment: no signs of nausea or vomiting Anesthetic complications: no    Last Vitals:  Filed Vitals:   06/14/15 1130 06/14/15 1149  BP: 125/75 146/76  Pulse: 80 74  Temp: 36.8 C 36.7 C  Resp: 14 16    Last Pain:  Filed Vitals:   06/14/15 1206  PainSc: 0-No pain                 Sarann Tregre A.

## 2015-06-14 NOTE — Anesthesia Preprocedure Evaluation (Signed)
Anesthesia Evaluation  Patient identified by MRN, date of birth, ID band Patient awake    Reviewed: Allergy & Precautions, NPO status , Patient's Chart, lab work & pertinent test results  Airway Mallampati: II  TM Distance: >3 FB     Dental no notable dental hx. (+) Teeth Intact   Pulmonary former smoker,    Pulmonary exam normal breath sounds clear to auscultation       Cardiovascular negative cardio ROS Normal cardiovascular exam Rhythm:Regular Rate:Normal     Neuro/Psych negative neurological ROS  negative psych ROS   GI/Hepatic negative GI ROS, Neg liver ROS,   Endo/Other  negative endocrine ROS  Renal/GU negative Renal ROS  negative genitourinary   Musculoskeletal negative musculoskeletal ROS (+)   Abdominal   Peds  Hematology negative hematology ROS (+)   Anesthesia Other Findings   Reproductive/Obstetrics Cervical polyp                             Anesthesia Physical Anesthesia Plan  ASA: I  Anesthesia Plan: General   Post-op Pain Management:    Induction: Intravenous  Airway Management Planned: LMA  Additional Equipment:   Intra-op Plan:   Post-operative Plan: Extubation in OR  Informed Consent: I have reviewed the patients History and Physical, chart, labs and discussed the procedure including the risks, benefits and alternatives for the proposed anesthesia with the patient or authorized representative who has indicated his/her understanding and acceptance.   Dental advisory given  Plan Discussed with: CRNA, Anesthesiologist and Surgeon  Anesthesia Plan Comments:         Anesthesia Quick Evaluation

## 2015-06-14 NOTE — H&P (Signed)
Kayla Maldonado is an 45 y.o. female. Presents for excision of cervical polyp.  Pertinent Gynecological History: Menses: flow is light Bleeding: none Contraception: condoms DES exposure: denies Blood transfusions: none Sexually transmitted diseases: no past history Previous GYN Procedures: none  Last mammogram: normal Date: 2015 Last pap: normal Date: 2015 OB History: G4, P4   Menstrual History: Menarche age: 66  Patient's last menstrual period was 05/12/2015 (exact date).    History reviewed. No pertinent past medical history.  Past Surgical History  Procedure Laterality Date  . Mouth surgery    . Upper gastrointestinal endoscopy      Family History  Problem Relation Age of Onset  . Diabetes Brother     Social History:  reports that she quit smoking about 18 years ago. She has never used smokeless tobacco. She reports that she drinks alcohol. She reports that she does not use illicit drugs.  Allergies:  Allergies  Allergen Reactions  . Codeine Itching    Prescriptions prior to admission  Medication Sig Dispense Refill Last Dose  . Multiple Vitamins-Minerals (AIRBORNE PO) Take 1 tablet by mouth daily.   Past Week at Unknown time  . cyclobenzaprine (FLEXERIL) 5 MG tablet Take 1 tablet (5 mg total) by mouth 2 (two) times daily as needed. (Patient not taking: Reported on 06/10/2015) 20 tablet 0   . fluconazole (DIFLUCAN) 150 MG tablet Take 1 tablet (150 mg total) by mouth once. (Patient not taking: Reported on 06/10/2015) 1 tablet 0   . metroNIDAZOLE (FLAGYL) 500 MG tablet Take 1 tablet (500 mg total) by mouth 2 (two) times daily. (Patient not taking: Reported on 06/10/2015) 14 tablet 2   . traMADol (ULTRAM) 50 MG tablet Take 1 tablet (50 mg total) by mouth every 6 (six) hours as needed. 15 tablet 0 More than a month at Unknown time    Review of Systems  All other systems reviewed and are negative.   Last menstrual period 05/12/2015. Physical Exam  Nursing note and vitals  reviewed. Constitutional: She is oriented to person, place, and time. She appears well-developed and well-nourished.  HENT:  Head: Normocephalic and atraumatic.  Eyes: Conjunctivae are normal. Pupils are equal, round, and reactive to light.  Neck: Normal range of motion. Neck supple.  Cardiovascular: Normal rate and regular rhythm.   Respiratory: Effort normal and breath sounds normal.  GI: Soft.  Genitourinary: Vagina normal and uterus normal.  Musculoskeletal: Normal range of motion.  Neurological: She is alert and oriented to person, place, and time.  Skin: Skin is warm and dry.  Psychiatric: She has a normal mood and affect. Her behavior is normal. Judgment and thought content normal.    No results found for this or any previous visit (from the past 24 hour(s)).  No results found.  Assessment/Plan: Cervical polyp.  For excision of cervical polyp.  Amyria Komar A 06/14/2015, 7:35 AM

## 2015-06-14 NOTE — Anesthesia Procedure Notes (Signed)
Procedure Name: LMA Insertion Date/Time: 06/14/2015 9:16 AM Performed by: Hewitt Blade Pre-anesthesia Checklist: Patient identified, Emergency Drugs available, Suction available and Patient being monitored Patient Re-evaluated:Patient Re-evaluated prior to inductionOxygen Delivery Method: Circle system utilized Preoxygenation: Pre-oxygenation with 100% oxygen Intubation Type: IV induction LMA: LMA inserted LMA Size: 4.0 Number of attempts: 1 Placement Confirmation: positive ETCO2 and breath sounds checked- equal and bilateral Tube secured with: Tape Dental Injury: Teeth and Oropharynx as per pre-operative assessment

## 2015-06-14 NOTE — Transfer of Care (Signed)
Immediate Anesthesia Transfer of Care Note  Patient: Kayla Maldonado  Procedure(s) Performed: Procedure(s): RESECTION OF CERVICAL POLYP (N/A)  Patient Location: PACU  Anesthesia Type:General  Level of Consciousness: awake, alert  and oriented  Airway & Oxygen Therapy: Patient Spontanous Breathing and Patient connected to nasal cannula oxygen  Post-op Assessment: Report given to RN, Post -op Vital signs reviewed and stable and Patient moving all extremities  Post vital signs: Reviewed and stable  Last Vitals:  Filed Vitals:   06/14/15 0739  BP: 113/89  Pulse: 87  Temp: 36.7 C  Resp: 16    Complications: No apparent anesthesia complications

## 2015-06-14 NOTE — Op Note (Signed)
Operative Note: PreOp Dx:  Cervical Polyp Post Op Dx:  Same Procedure:  Excision of cervical polyp Anesthesia:  General EBL:  23ml IVF's:  1225ml Urine:  99991111 Complications:  None Specimen:  Cervical polyp  Description of Procedure:      The patient was brought to the OR room 8 and placed in the supine position.  General endotracheal anesthesia was induced and the legs were brought up in stirrups.  A time out was held and the patient was identified as Kayla Maldonado, and the procedure as Excision of Cervical Polyp.  The vagina was prepped and draped in the usual sterile fashion.  A sterile Graves speculum was introduced into the vagina and the cervix was isolated.  The cervical polyp was grasped with forceps and excised across the base with the Bovie.  The base of the polyp was cauterized with the Bovie and excellent hemostasis was obtained.  The procedure was then terminated and all instruments were retired.  Anesthesia was reversed and the patient was taken to the recovery room in good condition.  Baltazar Najjar MD 06-14-15

## 2015-06-14 NOTE — Discharge Instructions (Signed)

## 2015-06-17 ENCOUNTER — Encounter (HOSPITAL_COMMUNITY): Payer: Self-pay | Admitting: Obstetrics

## 2015-07-04 ENCOUNTER — Encounter: Payer: BC Managed Care – PPO | Admitting: Obstetrics

## 2015-07-11 ENCOUNTER — Encounter: Payer: Self-pay | Admitting: Obstetrics

## 2015-07-11 ENCOUNTER — Ambulatory Visit (INDEPENDENT_AMBULATORY_CARE_PROVIDER_SITE_OTHER): Payer: BC Managed Care – PPO | Admitting: Obstetrics

## 2015-07-11 VITALS — BP 124/84 | HR 76 | Temp 98.2°F | Wt 157.0 lb

## 2015-07-11 DIAGNOSIS — N841 Polyp of cervix uteri: Secondary | ICD-10-CM | POA: Diagnosis not present

## 2015-07-11 DIAGNOSIS — Z4889 Encounter for other specified surgical aftercare: Secondary | ICD-10-CM

## 2015-07-11 DIAGNOSIS — Z9889 Other specified postprocedural states: Secondary | ICD-10-CM

## 2015-07-11 NOTE — Progress Notes (Signed)
Patient ID: Kayla Maldonado, female   DOB: [birth year], 45 y.o.   MRN: [id number]  Chief Complaint  Patient presents with  . Routine Post Op    cervical polyp removal on 06/14/15.     HPI Kayla Maldonado is a 45 y.o. female.  H/O cervical polyp.  S/P excision of cervical polyp.  No complaints.  HPI  Past Medical History  Diagnosis Date  . Vaginal delivery 1992, 1993, 1995, 1999    Past Surgical History  Procedure Laterality Date  . Mouth surgery    . Upper gastrointestinal endoscopy    . Dilatation & currettage/hysteroscopy with resectocope N/A 06/14/2015    Procedure: RESECTION OF CERVICAL POLYP;  Surgeon: Shelly Bombard, MD;  Location: [facility];  Service: Gynecology;  Laterality: N/A;    Family History  Problem Relation Age of Onset  . Diabetes Brother     Social History Social History  Substance Use Topics  . Smoking status: Former Smoker    Quit date: 06/01/1997  . Smokeless tobacco: Never Used  . Alcohol Use: 0.0 oz/week    0 Standard drinks or equivalent per week     Comment: occasionally    Allergies  Allergen Reactions  . Codeine Itching    Current Outpatient Prescriptions  Medication Sig Dispense Refill  . cyclobenzaprine (FLEXERIL) 5 MG tablet Take 1 tablet (5 mg total) by mouth 2 (two) times daily as needed. (Patient not taking: Reported on 06/10/2015) 20 tablet 0  . ibuprofen (ADVIL,MOTRIN) 800 MG tablet Take 1 tablet (800 mg total) by mouth every 8 (eight) hours as needed. (Patient not taking: Reported on 07/11/2015) 30 tablet 5  . Multiple Vitamins-Minerals (AIRBORNE PO) Take 1 tablet by mouth daily. Reported on 07/11/2015    . traMADol (ULTRAM) 50 MG tablet Take 2 tablets (100 mg total) by mouth every 6 (six) hours as needed for moderate pain. (Patient not taking: Reported on 07/11/2015) 40 tablet 2  . traMADol (ULTRAM) 50 MG tablet Take 1-2 tablets (50-100 mg total) by mouth every 6 (six) hours as needed. (Patient not taking: Reported on 07/11/2015) 40 tablet 2    No current facility-administered medications for this visit.    Review of Systems Review of Systems Constitutional: negative for fatigue and weight loss Respiratory: negative for cough and wheezing Cardiovascular: negative for chest pain, fatigue and palpitations Gastrointestinal: negative for abdominal pain and change in bowel habits Genitourinary:negative Integument/breast: negative for nipple discharge Musculoskeletal:negative for myalgias Neurological: negative for gait problems and tremors Behavioral/Psych: negative for abusive relationship, depression Endocrine: negative for temperature intolerance     Blood pressure 124/84, pulse 76, temperature 98.2 F (36.8 C), weight 157 lb (71.215 kg), last menstrual period 07/02/2015.  Physical Exam Physical Exam            General:  Alert and no distress Abdomen:  normal findings: no organomegaly, soft, non-tender and no hernia  Pelvis:  External genitalia: normal general appearance Urinary system: urethral meatus normal and bladder without fullness, nontender Vaginal: normal without tenderness, induration or masses Cervix: normal appearance Adnexa: normal bimanual exam Uterus: anteverted and non-tender, normal size      Data Reviewed Pathology  Assessment     Cervical polyp.  S/P excision of cervical polyp.  Doing well.     Plan    F/U prn   Orders Placed This Encounter  Procedures  . SureSwab, Vaginosis/Vaginitis Plus   No orders of the defined types were placed in this encounter.

## 2015-07-16 LAB — SURESWAB, VAGINOSIS/VAGINITIS PLUS
ATOPOBIUM VAGINAE: NOT DETECTED Log (cells/mL)
C. ALBICANS, DNA: NOT DETECTED
C. GLABRATA, DNA: NOT DETECTED
C. PARAPSILOSIS, DNA: NOT DETECTED
C. TRACHOMATIS RNA, TMA: NOT DETECTED
C. TROPICALIS, DNA: NOT DETECTED
Gardnerella vaginalis: 4.7 Log (cells/mL)
LACTOBACILLUS SPECIES: DETECTED Log (cells/mL)
MEGASPHAERA SPECIES: NOT DETECTED Log (cells/mL)
N. GONORRHOEAE RNA, TMA: NOT DETECTED
T. VAGINALIS RNA, QL TMA: NOT DETECTED

## 2015-08-23 NOTE — Telephone Encounter (Signed)
Patient seen since phone call note.

## 2015-08-29 ENCOUNTER — Ambulatory Visit (INDEPENDENT_AMBULATORY_CARE_PROVIDER_SITE_OTHER): Payer: BC Managed Care – PPO | Admitting: Obstetrics

## 2015-08-29 ENCOUNTER — Encounter: Payer: Self-pay | Admitting: Obstetrics

## 2015-08-29 VITALS — BP 148/97 | HR 96 | Temp 98.0°F | Wt 156.0 lb

## 2015-08-29 DIAGNOSIS — N939 Abnormal uterine and vaginal bleeding, unspecified: Secondary | ICD-10-CM

## 2015-08-29 DIAGNOSIS — D251 Intramural leiomyoma of uterus: Secondary | ICD-10-CM

## 2015-08-29 NOTE — Progress Notes (Signed)
Patient ID: Kayla Maldonado, female   DOB: November 10, 1970, 45 y.o.   MRN: [id number]  Chief Complaint  Patient presents with  . Vaginal Bleeding    Patient states she is had a polyp removed on 07/11/15. Patient states she has had irregular cycles since then. Paitent states she has been bleeding the entire month of March.     HPI Kayla Maldonado is a 45 y.o. female.  Bleeding off and on since polypectomy.  HPI  Past Medical History  Diagnosis Date  . Vaginal delivery 1992, 1993, 1995, 1999    Past Surgical History  Procedure Laterality Date  . Mouth surgery    . Upper gastrointestinal endoscopy    . Dilatation & currettage/hysteroscopy with resectocope N/A 06/14/2015    Procedure: RESECTION OF CERVICAL POLYP;  Surgeon: Shelly Bombard, MD;  Location: [facility];  Service: Gynecology;  Laterality: N/A;    Family History  Problem Relation Age of Onset  . Diabetes Brother     Social History Social History  Substance Use Topics  . Smoking status: Former Smoker    Quit date: 06/01/1997  . Smokeless tobacco: Never Used  . Alcohol Use: 0.0 oz/week    0 Standard drinks or equivalent per week     Comment: occasionally    Allergies  Allergen Reactions  . Codeine Itching    Current Outpatient Prescriptions  Medication Sig Dispense Refill  . Multiple Vitamins-Minerals (AIRBORNE PO) Take 1 tablet by mouth daily. Reported on 07/11/2015     No current facility-administered medications for this visit.    Review of Systems Review of Systems Constitutional: negative for fatigue and weight loss Respiratory: negative for cough and wheezing Cardiovascular: negative for chest pain, fatigue and palpitations Gastrointestinal: negative for abdominal pain and change in bowel habits Genitourinary: positive for vaginal bleeding off and on, but no cramping Integument/breast: negative for nipple discharge Musculoskeletal:negative for myalgias Neurological: negative for gait problems and  tremors Behavioral/Psych: negative for abusive relationship, depression Endocrine: negative for temperature intolerance     Blood pressure 148/97, pulse 96, temperature 98 F (36.7 C), weight 156 lb (70.761 kg), last menstrual period 07/31/2015.  Physical Exam Physical Exam            General:  Alert and no distress Abdomen:  normal findings: no organomegaly, soft, non-tender and no hernia  Pelvis:  External genitalia: normal general appearance Urinary system: urethral meatus normal and bladder without fullness, nontender Vaginal: normal without tenderness, induration or masses Cervix: normal appearance Adnexa: normal bimanual exam Uterus: anteverted and non-tender, normal size     Data Reviewed Labs Pathology from polypectomy  Assessment     AUB, probably hormonal imbalance     Plan    Ultrasound ordered to F/U fibroids F/U 3 weeks  Orders Placed This Encounter  Procedures  . US Transvaginal Non-OB    Standing Status: Future     Number of Occurrences:      Standing Expiration Date: 10/28/2016    Order Specific Question:  Reason for Exam (SYMPTOM  OR DIAGNOSIS REQUIRED)    Answer:  F/U fibroids    Order Specific Question:  Preferred imaging location?    Answer:  [facility]  . US Pelvis Complete    Standing Status: Future     Number of Occurrences:      Standing Expiration Date: 10/28/2016    Order Specific Question:  Reason for Exam (SYMPTOM  OR DIAGNOSIS REQUIRED)    Answer:  F/U fibroids  Order Specific Question:  Preferred imaging location?    Answer:  [facility]  . NuSwab Vaginitis Plus (VG+)   No orders of the defined types were placed in this encounter.

## 2015-09-02 LAB — NUSWAB VAGINITIS PLUS (VG+)
Candida albicans, NAA: NEGATIVE
Candida glabrata, NAA: NEGATIVE
Chlamydia trachomatis, NAA: NEGATIVE
NEISSERIA GONORRHOEAE, NAA: NEGATIVE
TRICH VAG BY NAA: NEGATIVE

## 2015-09-09 ENCOUNTER — Ambulatory Visit (HOSPITAL_COMMUNITY)
Admission: RE | Admit: 2015-09-09 | Discharge: 2015-09-09 | Disposition: A | Discharge status: Home / Self Care | Payer: BC Managed Care – PPO | Source: Ambulatory Visit | Attending: Obstetrics | Admitting: Obstetrics

## 2015-09-09 DIAGNOSIS — N939 Abnormal uterine and vaginal bleeding, unspecified: Secondary | ICD-10-CM | POA: Diagnosis not present

## 2015-09-09 DIAGNOSIS — N83202 Unspecified ovarian cyst, left side: Secondary | ICD-10-CM | POA: Diagnosis not present

## 2015-09-09 DIAGNOSIS — D251 Intramural leiomyoma of uterus: Secondary | ICD-10-CM

## 2015-09-09 DIAGNOSIS — D252 Subserosal leiomyoma of uterus: Secondary | ICD-10-CM | POA: Diagnosis not present

## 2015-09-18 ENCOUNTER — Encounter: Payer: Self-pay | Admitting: Obstetrics

## 2015-09-18 ENCOUNTER — Ambulatory Visit (INDEPENDENT_AMBULATORY_CARE_PROVIDER_SITE_OTHER): Payer: BC Managed Care – PPO | Admitting: Obstetrics

## 2015-09-18 VITALS — BP 128/88 | HR 69 | Wt 158.0 lb

## 2015-09-18 DIAGNOSIS — N939 Abnormal uterine and vaginal bleeding, unspecified: Secondary | ICD-10-CM | POA: Diagnosis not present

## 2015-09-18 DIAGNOSIS — D251 Intramural leiomyoma of uterus: Secondary | ICD-10-CM

## 2015-09-18 NOTE — Progress Notes (Signed)
Patient ID: Kayla Maldonado, female   DOB: 01-16-1971, 45 y.o.   MRN: [id number]  Chief Complaint  Patient presents with  . Follow-up    ultrasound from 09-09-15    HPI Kayla Maldonado is a 45 y.o. female.  H/O uterine fibroids with AUB.  Presents for U/S results.  HPI  Past Medical History  Diagnosis Date  . Vaginal delivery 1992, 1993, 1995, 1999    Past Surgical History  Procedure Laterality Date  . Mouth surgery    . Upper gastrointestinal endoscopy    . Dilatation & currettage/hysteroscopy with resectocope N/A 06/14/2015    Procedure: RESECTION OF CERVICAL POLYP;  Surgeon: Shelly Bombard, MD;  Location: [facility];  Service: Gynecology;  Laterality: N/A;    Family History  Problem Relation Age of Onset  . Diabetes Brother     Social History Social History  Substance Use Topics  . Smoking status: Former Smoker    Quit date: 06/01/1997  . Smokeless tobacco: Never Used  . Alcohol Use: 0.0 oz/week    0 Standard drinks or equivalent per week     Comment: occasionally    Allergies  Allergen Reactions  . Codeine Itching    Current Outpatient Prescriptions  Medication Sig Dispense Refill  . Multiple Vitamins-Minerals (AIRBORNE PO) Take 1 tablet by mouth daily. Reported on 07/11/2015     No current facility-administered medications for this visit.    Review of Systems Review of Systems Constitutional: negative for fatigue and weight loss Respiratory: negative for cough and wheezing Cardiovascular: negative for chest pain, fatigue and palpitations Gastrointestinal: negative for abdominal pain and change in bowel habits Genitourinary: positive for heavy and painful periods Integument/breast: negative for nipple discharge Musculoskeletal:negative for myalgias Neurological: negative for gait problems and tremors Behavioral/Psych: negative for abusive relationship, depression Endocrine: negative for temperature intolerance     Blood pressure 128/88, pulse 69, weight 158  lb (71.668 kg), last menstrual period 09/13/2015.  Physical Exam Physical Exam:  Deferred  100% of 15 min visit spent on counseling and coordination of care.   Data Reviewed Ultrasound  Assessment     Uterine fibroids.  Stable.     Plan    Continue yearly pelvic exams. F/U in 6 months for pap.   No orders of the defined types were placed in this encounter.   No orders of the defined types were placed in this encounter.

## 2015-09-19 ENCOUNTER — Ambulatory Visit: Payer: BC Managed Care – PPO | Admitting: Obstetrics

## 2016-03-17 ENCOUNTER — Ambulatory Visit (INDEPENDENT_AMBULATORY_CARE_PROVIDER_SITE_OTHER): Payer: BC Managed Care – PPO | Admitting: Obstetrics

## 2016-03-17 ENCOUNTER — Encounter: Payer: Self-pay | Admitting: Obstetrics

## 2016-03-17 ENCOUNTER — Encounter: Payer: Self-pay | Admitting: *Deleted

## 2016-03-17 DIAGNOSIS — Z124 Encounter for screening for malignant neoplasm of cervix: Secondary | ICD-10-CM | POA: Diagnosis not present

## 2016-03-17 DIAGNOSIS — Z01419 Encounter for gynecological examination (general) (routine) without abnormal findings: Secondary | ICD-10-CM | POA: Diagnosis not present

## 2016-03-17 DIAGNOSIS — Z113 Encounter for screening for infections with a predominantly sexual mode of transmission: Secondary | ICD-10-CM

## 2016-03-17 DIAGNOSIS — N939 Abnormal uterine and vaginal bleeding, unspecified: Secondary | ICD-10-CM

## 2016-03-17 DIAGNOSIS — Z1151 Encounter for screening for human papillomavirus (HPV): Secondary | ICD-10-CM | POA: Diagnosis not present

## 2016-03-17 DIAGNOSIS — N951 Menopausal and female climacteric states: Secondary | ICD-10-CM | POA: Diagnosis not present

## 2016-03-17 NOTE — Addendum Note (Signed)
Addended by: Manuela Schwartz C on: 03/17/2016 01:11 PM   Modules accepted: Orders

## 2016-03-17 NOTE — Progress Notes (Signed)
Subjective:        Kayla Maldonado is a 45 y.o. female here for a routine exam.  Current complaints: Hot flashes..  Still having regular cycles and denies vaginal dryness.  Personal health questionnaire:  Is patient [name], have a family history of breast and/or ovarian cancer: no Is there a family history of uterine cancer diagnosed at age < 62, gastrointestinal cancer, urinary tract cancer, family member who is a Field seismologist syndrome-associated carrier: no Is the patient overweight and hypertensive, family history of diabetes, personal history of gestational diabetes, preeclampsia or PCOS: no Is patient over 16, have PCOS,  family history of premature CHD under age 34, diabetes, smoke, have hypertension or peripheral artery disease:  no At any time, has a partner hit, kicked or otherwise hurt or frightened you?: no Over the past 2 weeks, have you felt down, depressed or hopeless?: no Over the past 2 weeks, have you felt little interest or pleasure in doing things?:no   Gynecologic History No LMP recorded. Contraception: condoms Last Pap: 2016. Results:  ASCUS, negative HPV Last mammogram: 2016. Results were: normal  Obstetric History OB History  Gravida Para Term Preterm AB Living  4 4 4     4   SAB TAB Ectopic Multiple Live Births          4    # Outcome Date GA Lbr Len/2nd Weight Sex Delivery Anes PTL Lv  4 Term 09/27/97    F Vag-Spont   LIV  3 Term 11/12/93    M Vag-Spont   LIV  2 Term 03/13/92    F Vag-Spont   LIV  1 Term 10/27/90    Kayla Maldonado      Past Medical History:  Diagnosis Date  . Vaginal delivery 1992, 1993, 1995, 1999    Past Surgical History:  Procedure Laterality Date  . DILATATION & CURRETTAGE/HYSTEROSCOPY WITH RESECTOCOPE N/A 06/14/2015   Procedure: RESECTION OF CERVICAL POLYP;  Surgeon: Shelly Bombard, MD;  Location: [facility];  Service: Gynecology;  Laterality: N/A;  . MOUTH SURGERY    . UPPER GASTROINTESTINAL ENDOSCOPY       Current  Outpatient Prescriptions:  Marland Kitchen  Multiple Vitamins-Minerals (AIRBORNE PO), Take 1 tablet by mouth daily. Reported on 07/11/2015, Disp: , Rfl:  Allergies  Allergen Reactions  . Codeine Itching    Social History  Substance Use Topics  . Smoking status: Former Smoker    Quit date: 06/01/1997  . Smokeless tobacco: Never Used  . Alcohol use 0.0 oz/week     Comment: occasionally    Family History  Problem Relation Age of Onset  . Diabetes Brother       Review of Systems  Constitutional: negative for fatigue and weight loss Respiratory: negative for cough and wheezing Cardiovascular: negative for chest pain, fatigue and palpitations Gastrointestinal: negative for abdominal pain and change in bowel habits Musculoskeletal:negative for myalgias Neurological: negative for gait problems and tremors Behavioral/Psych: negative for abusive relationship, depression Endocrine: negative for temperature intolerance   Genitourinary:negative for abnormal menstrual periods, genital lesions, hot flashes, sexual problems and vaginal discharge Integument/breast: negative for breast lump, breast tenderness, nipple discharge and skin lesion(s)    Objective:       BP (!) 132/94   Pulse 70   Wt 161 lb 9.6 oz (73.3 kg)   BMI 26.89 kg/m  General:   alert  Skin:   no rash or abnormalities  Lungs:   clear to auscultation bilaterally  Heart:   regular rate and rhythm, S1, S2 normal, no murmur, click, rub or gallop  Breasts:   normal without suspicious masses, skin or nipple changes or axillary nodes  Abdomen:  normal findings: no organomegaly, soft, non-tender and no hernia  Pelvis:  External genitalia: normal general appearance Urinary system: urethral meatus normal and bladder without fullness, nontender Vaginal: normal without tenderness, induration or masses Cervix: normal appearance Adnexa: normal bimanual exam Uterus: anteverted and non-tender, normal size   Lab Review Urine pregnancy test Labs  reviewed yes Radiologic studies reviewed yes  50% of 20 min visit spent on counseling and coordination of care.   Assessment:    Healthy female exam.    Menopausal symptoms - Hot flashes   Plan:    Education reviewed: calcium supplements, low fat, low cholesterol diet, safe sex/STD prevention, self breast exams and weight bearing exercise. Contraception: condoms. Follow up in: 1 year.   No orders of the defined types were placed in this encounter.  No orders of the defined types were placed in this encounter.    Patient ID: Kayla Maldonado, female   DOB: [birth year], 45 y.o.   MRN: [id number]

## 2016-03-18 LAB — CERVICOVAGINAL ANCILLARY ONLY
Chlamydia: NEGATIVE
Neisseria Gonorrhea: NEGATIVE
TRICH (WINDOWPATH): NEGATIVE

## 2016-03-20 LAB — CYTOLOGY - PAP
DIAGNOSIS: NEGATIVE
HPV (WINDOPATH): NOT DETECTED

## 2016-03-20 LAB — CERVICOVAGINAL ANCILLARY ONLY
Bacterial vaginitis: NEGATIVE
Candida vaginitis: NEGATIVE

## 2016-06-16 ENCOUNTER — Other Ambulatory Visit: Payer: Self-pay | Admitting: *Deleted

## 2016-06-16 DIAGNOSIS — B9689 Other specified bacterial agents as the cause of diseases classified elsewhere: Secondary | ICD-10-CM

## 2016-06-16 DIAGNOSIS — N760 Acute vaginitis: Principal | ICD-10-CM

## 2016-06-16 MED ORDER — METRONIDAZOLE 500 MG PO TABS: 500.0000 mg | ORAL_TABLET | Freq: Two times a day (BID) | ORAL | 0 refills | Status: DC

## 2016-06-16 NOTE — Progress Notes (Signed)
Pt called to office with symptoms consistent with BV, change in d/c and vaginal "fishy" odor. Pt treated per protocol. Flagyl 500mg  sent to pharmacy.  Pt advised if symptoms remain after treatment, call for appt.

## 2017-03-10 ENCOUNTER — Other Ambulatory Visit: Payer: Self-pay | Admitting: Obstetrics

## 2017-03-10 DIAGNOSIS — Z1231 Encounter for screening mammogram for malignant neoplasm of breast: Secondary | ICD-10-CM

## 2017-03-29 ENCOUNTER — Ambulatory Visit (INDEPENDENT_AMBULATORY_CARE_PROVIDER_SITE_OTHER): Payer: BC Managed Care – PPO | Admitting: Obstetrics

## 2017-03-29 ENCOUNTER — Encounter: Payer: Self-pay | Admitting: Obstetrics

## 2017-03-29 VITALS — BP 144/88 | HR 72 | Ht 65.0 in | Wt 170.8 lb

## 2017-03-29 DIAGNOSIS — Z124 Encounter for screening for malignant neoplasm of cervix: Secondary | ICD-10-CM

## 2017-03-29 DIAGNOSIS — D251 Intramural leiomyoma of uterus: Secondary | ICD-10-CM

## 2017-03-29 DIAGNOSIS — Z1151 Encounter for screening for human papillomavirus (HPV): Secondary | ICD-10-CM | POA: Diagnosis not present

## 2017-03-29 DIAGNOSIS — Z113 Encounter for screening for infections with a predominantly sexual mode of transmission: Secondary | ICD-10-CM | POA: Diagnosis not present

## 2017-03-29 DIAGNOSIS — Z01419 Encounter for gynecological examination (general) (routine) without abnormal findings: Secondary | ICD-10-CM

## 2017-03-29 NOTE — Progress Notes (Signed)
Patient is in the office for annual exam, last pap 03-17-16.

## 2017-03-29 NOTE — Progress Notes (Signed)
Subjective:        Kayla Maldonado is a 46 y.o. female here for a routine exam.  Current complaints: None.    Personal health questionnaire:  Is patient [name], have a family history of breast and/or ovarian cancer: no Is there a family history of uterine cancer diagnosed at age < 27, gastrointestinal cancer, urinary tract cancer, family member who is a Field seismologist syndrome-associated carrier: no Is the patient overweight and hypertensive, family history of diabetes, personal history of gestational diabetes, preeclampsia or PCOS: no Is patient over 33, have PCOS,  family history of premature CHD under age 1, diabetes, smoke, have hypertension or peripheral artery disease:  no At any time, has a partner hit, kicked or otherwise hurt or frightened you?: no Over the past 2 weeks, have you felt down, depressed or hopeless?: no Over the past 2 weeks, have you felt little interest or pleasure in doing things?:no   Gynecologic History Patient's last menstrual period was 03/08/2017. Contraception: condoms Last Pap: 2017. Results were: normal Last mammogram: 2016. Results were: normal  Obstetric History OB History  Gravida Para Term Preterm AB Living  4 4 4     4   SAB TAB Ectopic Multiple Live Births          4    # Outcome Date GA Lbr Len/2nd Weight Sex Delivery Anes PTL Lv  4 Term 09/27/97    F Vag-Spont   LIV  3 Term 11/12/93    M Vag-Spont   LIV  2 Term 03/13/92    F Vag-Spont   LIV  1 Term 10/27/90    Kayla Maldonado      Past Medical History:  Diagnosis Date  . Vaginal delivery 1992, 1993, 1995, 1999    Past Surgical History:  Procedure Laterality Date  . DILATATION & CURRETTAGE/HYSTEROSCOPY WITH RESECTOCOPE N/A 06/14/2015   Procedure: RESECTION OF CERVICAL POLYP;  Surgeon: Shelly Bombard, MD;  Location: [facility];  Service: Gynecology;  Laterality: N/A;  . MOUTH SURGERY    . UPPER GASTROINTESTINAL ENDOSCOPY       Current Outpatient Prescriptions:  .   metroNIDAZOLE (FLAGYL) 500 MG tablet, Take 1 tablet (500 mg total) by mouth 2 (two) times daily. (Patient not taking: Reported on 03/29/2017), Disp: 14 tablet, Rfl: 0 .  Multiple Vitamins-Minerals (AIRBORNE PO), Take 1 tablet by mouth daily. Reported on 07/11/2015, Disp: , Rfl:  Allergies  Allergen Reactions  . Codeine Itching    Social History  Substance Use Topics  . Smoking status: Former Smoker    Quit date: 06/01/1997  . Smokeless tobacco: Never Used  . Alcohol use 0.0 oz/week     Comment: occasionally    Family History  Problem Relation Age of Onset  . Diabetes Brother       Review of Systems  Constitutional: negative for fatigue and weight loss Respiratory: negative for cough and wheezing Cardiovascular: negative for chest pain, fatigue and palpitations Gastrointestinal: negative for abdominal pain and change in bowel habits Musculoskeletal:negative for myalgias Neurological: negative for gait problems and tremors Behavioral/Psych: negative for abusive relationship, depression Endocrine: negative for temperature intolerance    Genitourinary:negative for abnormal menstrual periods, genital lesions, hot flashes, sexual problems and vaginal discharge Integument/breast: negative for breast lump, breast tenderness, nipple discharge and skin lesion(s)    Objective:       BP (!) 144/88   Pulse 72   Ht 5\' 5"  (1.651 m)   Wt 170 lb  12.8 oz (77.5 kg)   LMP 03/08/2017   BMI 28.42 kg/m  General:   alert  Skin:   no rash or abnormalities  Lungs:   clear to auscultation bilaterally  Heart:   regular rate and rhythm, S1, S2 normal, no murmur, click, rub or gallop  Breasts:   normal without suspicious masses, skin or nipple changes or axillary nodes  Abdomen:  normal findings: no organomegaly, soft, non-tender and no hernia  Pelvis:  External genitalia: normal general appearance Urinary system: urethral meatus normal and bladder without fullness, nontender Vaginal: normal  without tenderness, induration or masses Cervix: normal appearance Adnexa: normal bimanual exam Uterus: anteverted and non-tender, enlarged, irregular contour   Lab Review Urine pregnancy test Labs reviewed yes Radiologic studies reviewed yes  50% of 20 min visit spent on counseling and coordination of care.    Assessment:     1. Encounter for routine gynecological examination with Papanicolaou smear of cervix Rx: - Cytology - PAP - Cervicovaginal ancillary only - Comprehensive metabolic panel - TSH - CBC - HIV antibody - Hepatitis B surface antigen - RPR - Hepatitis C antibody  2. Fibroids, intramural Rx: - US PELVIC COMPLETE WITH TRANSVAGINAL; Future   Plan:    Education reviewed: calcium supplements, depression evaluation, low fat, low cholesterol diet, safe sex/STD prevention, self breast exams and weight bearing exercise. Contraception: condoms. Follow up in: 2 weeks.   No orders of the defined types were placed in this encounter.  No orders of the defined types were placed in this encounter.

## 2017-03-30 LAB — COMPREHENSIVE METABOLIC PANEL
A/G RATIO: 1.5 (ref 1.2–2.2)
ALK PHOS: 44 IU/L (ref 39–117)
ALT: 14 IU/L (ref 0–32)
AST: 16 IU/L (ref 0–40)
Albumin: 4.1 g/dL (ref 3.5–5.5)
BUN/Creatinine Ratio: 13 (ref 9–23)
BUN: 11 mg/dL (ref 6–24)
Bilirubin Total: 0.3 mg/dL (ref 0.0–1.2)
CO2: 21 mmol/L (ref 20–29)
Calcium: 9.3 mg/dL (ref 8.7–10.2)
Chloride: 104 mmol/L (ref 96–106)
Creatinine, Ser: 0.85 mg/dL (ref 0.57–1.00)
GFR calc Af Amer: 96 mL/min/{1.73_m2} (ref >59)
GFR calc non Af Amer: 83 mL/min/{1.73_m2} (ref >59)
GLOBULIN, TOTAL: 2.7 g/dL (ref 1.5–4.5)
Glucose: 82 mg/dL (ref 65–99)
POTASSIUM: 4.4 mmol/L (ref 3.5–5.2)
Sodium: 139 mmol/L (ref 134–144)
Total Protein: 6.8 g/dL (ref 6.0–8.5)

## 2017-03-30 LAB — HEPATITIS C ANTIBODY: Hep C Virus Ab: <0.1 s/co ratio (ref 0.0–0.9)

## 2017-03-30 LAB — CERVICOVAGINAL ANCILLARY ONLY
BACTERIAL VAGINITIS: NEGATIVE
CANDIDA VAGINITIS: NEGATIVE
CHLAMYDIA, DNA PROBE: NEGATIVE
Neisseria Gonorrhea: NEGATIVE
Trichomonas: NEGATIVE

## 2017-03-30 LAB — RPR: RPR: NONREACTIVE

## 2017-03-30 LAB — HIV ANTIBODY (ROUTINE TESTING W REFLEX): HIV Screen 4th Generation wRfx: NONREACTIVE

## 2017-03-30 LAB — CBC
Hematocrit: 38.6 % (ref 34.0–46.6)
Hemoglobin: 12.8 g/dL (ref 11.1–15.9)
MCH: 27.7 pg (ref 26.6–33.0)
MCHC: 33.2 g/dL (ref 31.5–35.7)
MCV: 84 fL (ref 79–97)
PLATELETS: 335 10*3/uL (ref 150–379)
RBC: 4.62 x10E6/uL (ref 3.77–5.28)
RDW: 15 % (ref 12.3–15.4)
WBC: 5 10*3/uL (ref 3.4–10.8)

## 2017-03-30 LAB — HEPATITIS B SURFACE ANTIGEN: HEP B S AG: NEGATIVE

## 2017-03-30 LAB — TSH: TSH: 1.52 u[IU]/mL (ref 0.450–4.500)

## 2017-03-31 LAB — CYTOLOGY - PAP
Diagnosis: NEGATIVE
HPV: NOT DETECTED

## 2017-04-07 ENCOUNTER — Ambulatory Visit (HOSPITAL_COMMUNITY)
Admission: RE | Admit: 2017-04-07 | Discharge: 2017-04-07 | Disposition: A | Discharge status: Home / Self Care | Payer: BC Managed Care – PPO | Source: Ambulatory Visit | Attending: Obstetrics | Admitting: Obstetrics

## 2017-04-07 ENCOUNTER — Other Ambulatory Visit: Payer: Self-pay | Admitting: Obstetrics

## 2017-04-07 DIAGNOSIS — D251 Intramural leiomyoma of uterus: Secondary | ICD-10-CM | POA: Diagnosis present

## 2017-04-12 ENCOUNTER — Ambulatory Visit (INDEPENDENT_AMBULATORY_CARE_PROVIDER_SITE_OTHER): Payer: BC Managed Care – PPO | Admitting: Obstetrics

## 2017-04-12 ENCOUNTER — Encounter: Payer: Self-pay | Admitting: Obstetrics

## 2017-04-12 VITALS — BP 149/84 | HR 72 | Wt 168.0 lb

## 2017-04-12 DIAGNOSIS — D251 Intramural leiomyoma of uterus: Secondary | ICD-10-CM | POA: Diagnosis not present

## 2017-04-12 NOTE — Progress Notes (Signed)
Patient ID: Kayla Maldonado, female   DOB: 1971-01-25, 46 y.o.   MRN: [id number]  Chief Complaint  Patient presents with  . Advice Only    [place] results    HPI Kayla Maldonado is a 46 y.o. female.  History of uterine fibroids, fairly asymptomatic.  Presents for follow up ultrasound results. HPI  Past Medical History:  Diagnosis Date  . Vaginal delivery 1992, 1993, 1995, 1999    Past Surgical History:  Procedure Laterality Date  . MOUTH SURGERY    . UPPER GASTROINTESTINAL ENDOSCOPY      Family History  Problem Relation Age of Onset  . Diabetes Brother     Social History Social History   Tobacco Use  . Smoking status: Former Smoker    Last attempt to quit: 06/01/1997    Years since quitting: 19.8  . Smokeless tobacco: Never Used  Substance Use Topics  . Alcohol use: Yes    Alcohol/week: 0.0 oz    Comment: occasionally  . Drug use: No    Allergies  Allergen Reactions  . Codeine Itching    Current Outpatient Medications  Medication Sig Dispense Refill  . Multiple Vitamins-Minerals (AIRBORNE PO) Take 1 tablet by mouth daily. Reported on 07/11/2015     No current facility-administered medications for this visit.     Review of Systems Review of Systems Constitutional: negative for fatigue and weight loss Respiratory: negative for cough and wheezing Cardiovascular: negative for chest pain, fatigue and palpitations Gastrointestinal: negative for abdominal pain and change in bowel habits Genitourinary:negative Integument/breast: negative for nipple discharge Musculoskeletal:negative for myalgias Neurological: negative for gait problems and tremors Behavioral/Psych: negative for abusive relationship, depression Endocrine: negative for temperature intolerance      Blood pressure (!) 149/84, pulse 72, weight 168 lb (76.2 kg), last menstrual period 04/01/2017.  Physical Exam Physical Exam:  Deferred  >50% of 10 min visit spent on counseling and coordination of care.     Data Reviewed Ultrasound:  Slight increase in volume of fibroids ( 400 cc to 600 cc ) over the past year.  Assessment     1. Fibroids, intramural - asymptomatic    Plan    Will follow clinically  No orders of the defined types were placed in this encounter.  No orders of the defined types were placed in this encounter.

## 2017-05-05 ENCOUNTER — Ambulatory Visit
Admission: RE | Admit: 2017-05-05 | Discharge: 2017-05-05 | Disposition: A | Discharge status: Home / Self Care | Payer: BC Managed Care – PPO | Source: Ambulatory Visit | Attending: Obstetrics | Admitting: Obstetrics

## 2017-05-05 DIAGNOSIS — Z1231 Encounter for screening mammogram for malignant neoplasm of breast: Secondary | ICD-10-CM

## 2018-01-12 ENCOUNTER — Other Ambulatory Visit: Payer: Self-pay

## 2018-01-12 ENCOUNTER — Telehealth: Payer: Self-pay

## 2018-01-12 DIAGNOSIS — N898 Other specified noninflammatory disorders of vagina: Secondary | ICD-10-CM

## 2018-01-12 MED ORDER — METRONIDAZOLE 500 MG PO TABS: 500.0000 mg | ORAL_TABLET | Freq: Two times a day (BID) | ORAL | 0 refills | Status: AC

## 2018-01-12 MED ORDER — FLUCONAZOLE 150 MG PO TABS
150.0000 mg | ORAL_TABLET | Freq: Once | ORAL | 0 refills | Status: AC
Start: 2018-01-12 — End: 2018-01-12

## 2018-01-12 NOTE — Progress Notes (Signed)
Rx sent per protocol for odor and vaginal discharge. Pt notified Rx sent.

## 2018-01-12 NOTE — Telephone Encounter (Signed)
TC from pt request Rx for BV  c/o odor and discharge Rx sent as permitted in protocol.  Pt notified.

## 2018-03-30 ENCOUNTER — Other Ambulatory Visit: Payer: Self-pay | Admitting: Obstetrics

## 2018-03-30 DIAGNOSIS — Z1231 Encounter for screening mammogram for malignant neoplasm of breast: Secondary | ICD-10-CM

## 2018-04-12 ENCOUNTER — Encounter: Payer: Self-pay | Admitting: Obstetrics

## 2018-04-12 ENCOUNTER — Ambulatory Visit (INDEPENDENT_AMBULATORY_CARE_PROVIDER_SITE_OTHER): Payer: BC Managed Care – PPO | Admitting: Obstetrics

## 2018-04-12 VITALS — BP 153/84 | HR 82 | Ht 65.0 in | Wt 165.0 lb

## 2018-04-12 DIAGNOSIS — Z124 Encounter for screening for malignant neoplasm of cervix: Secondary | ICD-10-CM

## 2018-04-12 DIAGNOSIS — N898 Other specified noninflammatory disorders of vagina: Secondary | ICD-10-CM | POA: Diagnosis not present

## 2018-04-12 DIAGNOSIS — D251 Intramural leiomyoma of uterus: Secondary | ICD-10-CM | POA: Diagnosis not present

## 2018-04-12 DIAGNOSIS — Z1151 Encounter for screening for human papillomavirus (HPV): Secondary | ICD-10-CM | POA: Diagnosis not present

## 2018-04-12 DIAGNOSIS — Z113 Encounter for screening for infections with a predominantly sexual mode of transmission: Secondary | ICD-10-CM | POA: Diagnosis not present

## 2018-04-12 DIAGNOSIS — Z01419 Encounter for gynecological examination (general) (routine) without abnormal findings: Secondary | ICD-10-CM | POA: Diagnosis not present

## 2018-04-12 NOTE — Progress Notes (Signed)
Patient presents for Annual Exam today.  CC:   NONE  Last pap: 03/29/17 Mammogram: 05/05/17 scheduled for 05/11/18 STD Screening: FULL PANEL  LMP:03/22/18     Subjective:        Kayla Maldonado is a 47 y.o. female here for a routine exam.  Current complaints: None.    Personal health questionnaire:  Is patient [name], have a family history of breast and/or ovarian cancer: no Is there a family history of uterine cancer diagnosed at age < 73, gastrointestinal cancer, urinary tract cancer, family member who is a Field seismologist syndrome-associated carrier: no Is the patient overweight and hypertensive, family history of diabetes, personal history of gestational diabetes, preeclampsia or PCOS: no Is patient over 79, have PCOS,  family history of premature CHD under age 46, diabetes, smoke, have hypertension or peripheral artery disease:  no At any time, has a partner hit, kicked or otherwise hurt or frightened you?: no Over the past 2 weeks, have you felt down, depressed or hopeless?: no Over the past 2 weeks, have you felt little interest or pleasure in doing things?:no   Gynecologic History Patient's last menstrual period was 03/22/2018 (exact date). Contraception: condoms Last Pap: 2018. Results were: normal Last mammogram: 2018. Results were: normal  Obstetric History OB History  Gravida Para Term Preterm AB Living  4 4 4     4   SAB TAB Ectopic Multiple Live Births          4    # Outcome Date GA Lbr Len/2nd Weight Sex Delivery Anes PTL Lv  4 Term 09/27/97    F Vag-Spont   LIV  3 Term 11/12/93    M Vag-Spont   LIV  2 Term 03/13/92    F Vag-Spont   LIV  1 Term 10/27/90    Kayla Maldonado    Past Medical History:  Diagnosis Date  . Vaginal delivery 1992, 1993, 1995, 1999    Past Surgical History:  Procedure Laterality Date  . DILATATION & CURRETTAGE/HYSTEROSCOPY WITH RESECTOCOPE N/A 06/14/2015   Procedure: RESECTION OF CERVICAL POLYP;  Surgeon: Shelly Bombard,  MD;  Location: [facility];  Service: Gynecology;  Laterality: N/A;  . MOUTH SURGERY    . UPPER GASTROINTESTINAL ENDOSCOPY       Current Outpatient Medications:  Marland Kitchen  Multiple Vitamins-Minerals (AIRBORNE PO), Take 1 tablet by mouth daily. Reported on 07/11/2015, Disp: , Rfl:  Allergies  Allergen Reactions  . Codeine Itching    Social History   Tobacco Use  . Smoking status: Former Smoker    Last attempt to quit: 06/01/1997    Years since quitting: 20.8  . Smokeless tobacco: Never Used  Substance Use Topics  . Alcohol use: Yes    Alcohol/week: 0.0 standard drinks    Comment: occasionally    Family History  Problem Relation Age of Onset  . Diabetes Brother   . Breast cancer Neg Hx       Review of Systems  Constitutional: negative for fatigue and weight loss Respiratory: negative for cough and wheezing Cardiovascular: negative for chest pain, fatigue and palpitations Gastrointestinal: negative for abdominal pain and change in bowel habits Musculoskeletal:negative for myalgias Neurological: negative for gait problems and tremors Behavioral/Psych: negative for abusive relationship, depression Endocrine: negative for temperature intolerance    Genitourinary:negative for abnormal menstrual periods, genital lesions, hot flashes, sexual problems and vaginal discharge Integument/breast: negative for breast lump, breast tenderness, nipple discharge and skin lesion(s)    Objective:  BP (!) 153/84   Pulse 82   Ht 5\' 5"  (1.651 m)   Wt 165 lb (74.8 kg)   LMP 03/22/2018 (Exact Date)   BMI 27.46 kg/m  General:   alert  Skin:   no rash or abnormalities  Lungs:   clear to auscultation bilaterally  Heart:   regular rate and rhythm, S1, S2 normal, no murmur, click, rub or gallop  Breasts:   normal without suspicious masses, skin or nipple changes or axillary nodes  Abdomen:  normal findings: no organomegaly, soft, non-tender and no hernia  Pelvis:  External genitalia: normal general  appearance Urinary system: urethral meatus normal and bladder without fullness, nontender Vaginal: normal without tenderness, induration or masses Cervix: normal appearance Adnexa: normal bimanual exam Uterus: anteverted and non-tender, normal size   Lab Review Urine pregnancy test Labs reviewed yes Radiologic studies reviewed yes  50% of 20 min visit spent on counseling and coordination of care.   Assessment:     1. Encounter for annual routine gynecological examination  2. Screening for cervical cancer Rx: - Cytology - PAP  3. Fibroids, intramural - stable clinically  4. Vaginal discharge Rx: - Cervicovaginal ancillary only  5. Screening examination for STD (sexually transmitted disease) Rx: - Hepatitis B surface antigen - Hepatitis C antibody - HIV Antibody (routine testing w rflx) - RPR  Plan:    Education reviewed: calcium supplements, depression evaluation, low fat, low cholesterol diet, safe sex/STD prevention, self breast exams and weight bearing exercise. Contraception: condoms. Follow up in: 1 year.   No orders of the defined types were placed in this encounter.  Orders Placed This Encounter  Procedures  . Hepatitis B surface antigen  . Hepatitis C antibody  . HIV Antibody (routine testing w rflx)  . RPR    Shelly Bombard MD 04-12-2018

## 2018-04-13 LAB — CERVICOVAGINAL ANCILLARY ONLY
BACTERIAL VAGINITIS: NEGATIVE
CANDIDA VAGINITIS: NEGATIVE
Chlamydia: NEGATIVE
Neisseria Gonorrhea: NEGATIVE
Trichomonas: NEGATIVE

## 2018-04-13 LAB — RPR: RPR: NONREACTIVE

## 2018-04-13 LAB — HEPATITIS B SURFACE ANTIGEN: Hepatitis B Surface Ag: NEGATIVE

## 2018-04-13 LAB — HEPATITIS C ANTIBODY: Hep C Virus Ab: <0.1 s/co ratio (ref 0.0–0.9)

## 2018-04-13 LAB — HIV ANTIBODY (ROUTINE TESTING W REFLEX): HIV Screen 4th Generation wRfx: NONREACTIVE

## 2018-04-14 LAB — CYTOLOGY - PAP
DIAGNOSIS: NEGATIVE
HPV (WINDOPATH): NOT DETECTED

## 2018-05-11 ENCOUNTER — Ambulatory Visit
Admission: RE | Admit: 2018-05-11 | Discharge: 2018-05-11 | Disposition: A | Discharge status: Home / Self Care | Payer: BC Managed Care – PPO | Source: Ambulatory Visit | Attending: Obstetrics | Admitting: Obstetrics

## 2018-05-11 DIAGNOSIS — Z1231 Encounter for screening mammogram for malignant neoplasm of breast: Secondary | ICD-10-CM

## 2018-05-12 ENCOUNTER — Other Ambulatory Visit: Payer: Self-pay | Admitting: Obstetrics

## 2018-05-12 DIAGNOSIS — R928 Other abnormal and inconclusive findings on diagnostic imaging of breast: Secondary | ICD-10-CM

## 2018-05-16 ENCOUNTER — Ambulatory Visit
Admission: RE | Admit: 2018-05-16 | Discharge: 2018-05-16 | Disposition: A | Discharge status: Home / Self Care | Payer: BC Managed Care – PPO | Source: Ambulatory Visit | Attending: Obstetrics | Admitting: Obstetrics

## 2018-05-16 ENCOUNTER — Ambulatory Visit: Payer: BC Managed Care – PPO

## 2018-05-16 DIAGNOSIS — R928 Other abnormal and inconclusive findings on diagnostic imaging of breast: Secondary | ICD-10-CM

## 2019-04-14 ENCOUNTER — Ambulatory Visit: Payer: BC Managed Care – PPO | Admitting: Obstetrics

## 2019-05-02 ENCOUNTER — Encounter: Payer: Self-pay | Admitting: Obstetrics

## 2019-05-02 ENCOUNTER — Other Ambulatory Visit: Payer: Self-pay

## 2019-05-02 ENCOUNTER — Ambulatory Visit (INDEPENDENT_AMBULATORY_CARE_PROVIDER_SITE_OTHER): Payer: BC Managed Care – PPO | Admitting: Obstetrics

## 2019-05-02 VITALS — BP 147/89 | Ht 65.0 in | Wt 167.0 lb

## 2019-05-02 DIAGNOSIS — Z113 Encounter for screening for infections with a predominantly sexual mode of transmission: Secondary | ICD-10-CM

## 2019-05-02 DIAGNOSIS — B9689 Other specified bacterial agents as the cause of diseases classified elsewhere: Secondary | ICD-10-CM

## 2019-05-02 DIAGNOSIS — N760 Acute vaginitis: Secondary | ICD-10-CM | POA: Diagnosis not present

## 2019-05-02 DIAGNOSIS — Z01419 Encounter for gynecological examination (general) (routine) without abnormal findings: Secondary | ICD-10-CM

## 2019-05-02 DIAGNOSIS — Z124 Encounter for screening for malignant neoplasm of cervix: Secondary | ICD-10-CM | POA: Diagnosis not present

## 2019-05-02 DIAGNOSIS — N898 Other specified noninflammatory disorders of vagina: Secondary | ICD-10-CM

## 2019-05-02 DIAGNOSIS — Z1151 Encounter for screening for human papillomavirus (HPV): Secondary | ICD-10-CM

## 2019-05-02 DIAGNOSIS — Z1239 Encounter for other screening for malignant neoplasm of breast: Secondary | ICD-10-CM

## 2019-05-02 NOTE — Progress Notes (Signed)
Patient presents for her Annual Exam today.  LMP:04/12/19 cycles are normal lasting 4-5 days heavy flow.  Last pap:04/12/2018 WNL  Contraception: None  STD Screening:None  Mammogram: 05/11/18 Family Hx of Breast Cancer: None   CC: None

## 2019-05-02 NOTE — Progress Notes (Signed)
Subjective:        Kayla Maldonado is a 48 y.o. female here for a routine exam.  Current complaints: None.    Personal health questionnaire:  Is patient [name], have a family history of breast and/or ovarian cancer: no Is there a family history of uterine cancer diagnosed at age < 60, gastrointestinal cancer, urinary tract cancer, family member who is a Field seismologist syndrome-associated carrier: no Is the patient overweight and hypertensive, family history of diabetes, personal history of gestational diabetes, preeclampsia or PCOS: yes Is patient over 72, have PCOS,  family history of premature CHD under age 31, diabetes, smoke, have hypertension or peripheral artery disease:  yes At any time, has a partner hit, kicked or otherwise hurt or frightened you?: no Over the past 2 weeks, have you felt down, depressed or hopeless?: no Over the past 2 weeks, have you felt little interest or pleasure in doing things?:no   Gynecologic History Patient's last menstrual period was 04/12/2019 (exact date). Contraception: condoms Last Pap: 04-11-2018. Results were: normal Last mammogram: 05-11-2018. Results were: normal  Obstetric History OB History  Gravida Para Term Preterm AB Living  4 4 4     4   SAB TAB Ectopic Multiple Live Births          4    # Outcome Date GA Lbr Len/2nd Weight Sex Delivery Anes PTL Lv  4 Term 09/27/97    F Vag-Spont   LIV  3 Term 11/12/93    M Vag-Spont   LIV  2 Term 03/13/92    F Vag-Spont   LIV  1 Term 10/27/90    [name]    Past Medical History:  Diagnosis Date  . Vaginal delivery 1992, 1993, 1995, 1999    Past Surgical History:  Procedure Laterality Date  . DILATATION & CURRETTAGE/HYSTEROSCOPY WITH RESECTOCOPE N/A 06/14/2015   Procedure: RESECTION OF CERVICAL POLYP;  Surgeon: Shelly Bombard, MD;  Location: [facility];  Service: Gynecology;  Laterality: N/A;  . MOUTH SURGERY    . UPPER GASTROINTESTINAL ENDOSCOPY       Current Outpatient  Medications:  Marland Kitchen  Multiple Vitamins-Minerals (AIRBORNE PO), Take 1 tablet by mouth daily. Reported on 07/11/2015, Disp: , Rfl:  Allergies  Allergen Reactions  . Codeine Itching    Social History   Tobacco Use  . Smoking status: Former Smoker    Quit date: 06/01/1997    Years since quitting: 21.9  . Smokeless tobacco: Never Used  Substance Use Topics  . Alcohol use: Yes    Alcohol/week: 0.0 standard drinks    Comment: occasionally    Family History  Problem Relation Age of Onset  . Diabetes Brother   . Breast cancer Neg Hx       Review of Systems  Constitutional: negative for fatigue and weight loss Respiratory: negative for cough and wheezing Cardiovascular: negative for chest pain, fatigue and palpitations Gastrointestinal: negative for abdominal pain and change in bowel habits Musculoskeletal:negative for myalgias Neurological: negative for gait problems and tremors Behavioral/Psych: negative for abusive relationship, depression Endocrine: negative for temperature intolerance    Genitourinary:negative for abnormal menstrual periods, genital lesions, hot flashes, sexual problems and vaginal discharge Integument/breast: negative for breast lump, breast tenderness, nipple discharge and skin lesion(s)    Objective:       BP (!) 147/89   Ht 5\' 5"  (1.651 m)   Wt 167 lb (75.8 kg)   LMP 04/12/2019 (Exact Date)   BMI  27.79 kg/m  General:   alert  Skin:   no rash or abnormalities  Lungs:   clear to auscultation bilaterally  Heart:   regular rate and rhythm, S1, S2 normal, no murmur, click, rub or gallop  Breasts:   normal without suspicious masses, skin or nipple changes or axillary nodes  Abdomen:  normal findings: no organomegaly, soft, non-tender and no hernia  Pelvis:  External genitalia: normal general appearance Urinary system: urethral meatus normal and bladder without fullness, nontender Vaginal: normal without tenderness, induration or masses Cervix: normal  appearance Adnexa: normal bimanual exam Uterus: anteverted and non-tender, normal size   Lab Review Urine pregnancy test Labs reviewed yes Radiologic studies reviewed yes  50% of 25 min visit spent on counseling and coordination of care.   Assessment:     1. Encounter for routine gynecological examination with Papanicolaou smear of cervix Rx: - Cytology - PAP( [place])  2. Vaginal discharge Rx: - Cervicovaginal ancillary only( [place])  3. Screening breast examination Rx: - MM Digital Screening; Future    Plan:    Education reviewed: calcium supplements, depression evaluation, low fat, low cholesterol diet, safe sex/STD prevention, self breast exams and weight bearing exercise. Contraception: condoms. Mammogram ordered. Follow up in: 1 year.   No orders of the defined types were placed in this encounter.  Orders Placed This Encounter  Procedures  . MM Digital Screening    Standing Status:   Future    Standing Expiration Date:   07/02/2020    Order Specific Question:   Reason for Exam (SYMPTOM  OR DIAGNOSIS REQUIRED)    Answer:   Screening    Order Specific Question:   Is the patient pregnant?    Answer:   No    Order Specific Question:   Preferred imaging location?    Answer:   [facility]    Shelly Bombard, MD 05/02/2019 3:09 PM

## 2019-05-04 LAB — CYTOLOGY - PAP
Comment: NEGATIVE
Diagnosis: NEGATIVE
High risk HPV: NEGATIVE

## 2019-05-04 LAB — CERVICOVAGINAL ANCILLARY ONLY
Bacterial Vaginitis (gardnerella): POSITIVE — AB
Candida Glabrata: NEGATIVE
Candida Vaginitis: NEGATIVE
Chlamydia: NEGATIVE
Comment: NEGATIVE
Comment: NEGATIVE
Comment: NEGATIVE
Comment: NEGATIVE
Comment: NEGATIVE
Comment: NORMAL
Neisseria Gonorrhea: NEGATIVE
Trichomonas: NEGATIVE

## 2019-05-05 ENCOUNTER — Other Ambulatory Visit: Payer: Self-pay | Admitting: Obstetrics

## 2019-05-05 DIAGNOSIS — N760 Acute vaginitis: Secondary | ICD-10-CM

## 2019-05-05 DIAGNOSIS — B9689 Other specified bacterial agents as the cause of diseases classified elsewhere: Secondary | ICD-10-CM

## 2019-05-05 MED ORDER — TINIDAZOLE 500 MG PO TABS: 1000.0000 mg | ORAL_TABLET | Freq: Every day | ORAL | 2 refills | Status: DC

## 2019-05-11 ENCOUNTER — Encounter: Payer: Self-pay | Admitting: Obstetrics & Gynecology

## 2019-05-11 ENCOUNTER — Ambulatory Visit (INDEPENDENT_AMBULATORY_CARE_PROVIDER_SITE_OTHER): Payer: BC Managed Care – PPO | Admitting: Obstetrics & Gynecology

## 2019-05-11 ENCOUNTER — Other Ambulatory Visit: Payer: Self-pay

## 2019-05-11 DIAGNOSIS — N920 Excessive and frequent menstruation with regular cycle: Secondary | ICD-10-CM

## 2019-05-11 DIAGNOSIS — D219 Benign neoplasm of connective and other soft tissue, unspecified: Secondary | ICD-10-CM

## 2019-05-11 NOTE — Patient Instructions (Signed)
Uterine Fibroids  Uterine fibroids are lumps of tissue (tumors) in your womb (uterus). They are not cancer (are benign). Most women with this condition do not need treatment. Sometimes fibroids can affect your ability to have children (your fertility). If that happens, you may need surgery to take out the fibroids. Follow these instructions at home:  Take over-the-counter and prescription medicines only as told by your doctor. Your doctor may suggest NSAIDs (such as aspirin or ibuprofen) to help with pain.  Ask your doctor if you should: ? Take iron pills. ? Eat more foods that have iron in them, such as dark green, leafy vegetables.  If directed, apply heat to your back or belly to reduce pain. Use the heat source that your doctor recommends, such as a moist heat pack or a heating pad. ? Put a towel between your skin and the heat source. ? Leave the heat on for 20-30 minutes. ? Remove the heat if your skin turns bright red. This is especially important if you are unable to feel pain, heat, or cold. You may have a greater risk of getting burned.  Pay close attention to your period (menstrual) cycles. Tell your doctor about any changes, such as: ? A heavier blood flow than usual. ? Needing to use more pads or tampons than normal. ? A change in how many days your period lasts. ? A change in symptoms that come with your period, such as cramps or back pain.  Keep all follow-up visits as told by your doctor. This is important. Your doctor may need to watch your fibroids over time for any changes. Contact a doctor if you:  Have pain that does not get better with medicine or heat, such as pain or cramps in: ? Your back. ? The area between your hip bones (pelvic area). ? Your belly.  Have new bleeding between your periods.  Have more bleeding during or between your periods.  Feel very tired or weak.  Feel light-headed. Get help right away if you:  Pass out (faint).  Have pain in the  area between your hip bones that suddenly gets worse.  Have bleeding that soaks a tampon or pad in 30 minutes or less. Summary  Uterine fibroids are lumps of tissue (tumors) in your womb (uterus). They are not cancer.  The only treatment that most women need is taking aspirin or ibuprofen for pain.  Contact a doctor if you have pain or cramps that do not get better with medicine.  Make sure you know what symptoms you should get help for right away. This information is not intended to replace advice given to you by your health care provider. Make sure you discuss any questions you have with your health care provider. Document Released: 06/20/2010 Document Revised: 04/30/2017 Document Reviewed: 04/13/2017 Elsevier Patient Education  2020 Elsevier Inc.  

## 2019-05-11 NOTE — Progress Notes (Signed)
Patient ID: Kayla Maldonado, female   DOB: 1971/01/03, 48 y.o.   MRN: [id number]  Chief Complaint  Patient presents with  . Gynecologic Exam  uterine fibroids  HPI Kayla Maldonado is a 48 y.o. female.  [id number] Patient's last menstrual period was 05/09/2019. Patient has regular heavy menses changing pad hourly when flow is heaviest for 2-3 years. Known history of fibroids. A CT done at [facility] facility 04/2019 showed increased size of the masses since 2016 examination and she comes to discuss management options. She does not want a hysterectomy. HPI  Past Medical History:  Diagnosis Date  . Vaginal delivery 1992, 1993, 1995, 1999    Past Surgical History:  Procedure Laterality Date  . DILATATION & CURRETTAGE/HYSTEROSCOPY WITH RESECTOCOPE N/A 06/14/2015   Procedure: RESECTION OF CERVICAL POLYP;  Surgeon: Shelly Bombard, MD;  Location: [facility];  Service: Gynecology;  Laterality: N/A;  . MOUTH SURGERY    . UPPER GASTROINTESTINAL ENDOSCOPY      Family History  Problem Relation Age of Onset  . Diabetes Brother   . Breast cancer Neg Hx     Social History Social History   Tobacco Use  . Smoking status: Former Smoker    Quit date: 06/01/1997    Years since quitting: 21.9  . Smokeless tobacco: Never Used  Substance Use Topics  . Alcohol use: Yes    Alcohol/week: 0.0 standard drinks    Comment: occasionally  . Drug use: No    Allergies  Allergen Reactions  . Codeine Itching    Current Outpatient Medications  Medication Sig Dispense Refill  . Multiple Vitamins-Minerals (AIRBORNE PO) Take 1 tablet by mouth daily. Reported on 07/11/2015    . tinidazole (TINDAMAX) 500 MG tablet Take 2 tablets (1,000 mg total) by mouth daily with breakfast. 10 tablet 2   No current facility-administered medications for this visit.    Review of Systems Review of Systems  Constitutional: Negative.   Respiratory: Negative.   Gastrointestinal: Positive for abdominal distention and abdominal pain.   Genitourinary: Positive for frequency, menstrual problem and pelvic pain. Negative for vaginal bleeding and vaginal discharge.    Blood pressure (!) 144/83, pulse 88, height 5\' 5"  (1.651 m), weight 167 lb (75.8 kg), last menstrual period 05/09/2019.  Physical Exam Physical Exam Vitals and nursing note reviewed.  Constitutional:      Appearance: Normal appearance.  Cardiovascular:     Rate and Rhythm: Normal rate.  Pulmonary:     Effort: Pulmonary effort is normal.  Abdominal:     General: Abdomen is flat.     Palpations: There is mass (to below umbilicus, firm c/w [id number] week size uterus).  Musculoskeletal:        General: Normal range of motion.  Skin:    General: Skin is warm and dry.     Coloration: Skin is not pale.  Neurological:     Mental Status: She is alert.  Psychiatric:        Mood and Affect: Mood normal.        Thought Content: Thought content normal.        Judgment: Judgment normal.     Data Reviewed Result Narrative  CLINICAL DATA: Periumbilical and right upper quadrant abdominal pain. Nausea.  EXAM: CT ABDOMEN AND PELVIS WITH CONTRAST  TECHNIQUE: Multidetector CT imaging of the abdomen and pelvis was performed using the standard protocol following bolus administration of intravenous contrast.  CONTRAST: 80 cc Omnipaque 350  COMPARISON: CT scan dated 04/15/2015  FINDINGS: Lower chest: Normal.  Hepatobiliary: No focal liver abnormality is seen. No gallstones, gallbladder wall thickening, or biliary dilatation.  Pancreas: Unremarkable. No pancreatic ductal dilatation or surrounding inflammatory changes.  Spleen: Normal in size without focal abnormality.  Adrenals/Urinary Tract: The adrenal glands and kidneys are normal. No hydronephrosis. Bladder is markedly compressed by the enlarged uterus.  Stomach/Bowel: Stomach is within normal limits. Appendix appears normal. No evidence of bowel wall thickening, distention, or inflammatory  changes. The bowel is displaced out of the pelvis by the markedly enlarged uterus.  Vascular/Lymphatic: No significant vascular findings are present. No enlarged abdominal or pelvic lymph nodes.  Reproductive: The uterus is markedly enlarged with numerous fibroids, measuring 14.4 x 12.8 by 15.2 cm. The right ovary appears normal. There small compressed cysts on the left ovary. The uterus has markedly increased in size since the prior exam.  Other: There is a small midline abdominal hernia just above the umbilicus containing a small amount of peritoneal fat and fluid, measuring 26 x 16 mm. This has increased slightly in size since the prior study.  Musculoskeletal: No acute or significant osseous findings.  IMPRESSION: 1. Marked enlargement of the uterus with numerous fibroids, significantly increased since the prior study of 2016. The enlarged uterus has a mass effect upon the adjacent bowel and bladder. 2. Slight enlargement of a small midline abdominal hernia just above the umbilicus containing a small amount of peritoneal fat and fluid.   Electronically Signed  By: Lorriane Shire M.D.  On: 04/24/2019 16:18  Other Result Information  Interface, Rad Results In - 04/24/2019  4:21 PM EST CLINICAL DATA:  Periumbilical and right upper quadrant abdominal pain. Nausea.  EXAM: CT ABDOMEN AND PELVIS WITH CONTRAST  TECHNIQUE: Multidetector CT imaging of the abdomen and pelvis was performed using the standard protocol following bolus administration of intravenous contrast.  CONTRAST:  80 cc Omnipaque 350  COMPARISON:  CT scan dated 04/15/2015  FINDINGS: Lower chest: Normal.  Hepatobiliary: No focal liver abnormality is seen. No gallstones, gallbladder wall thickening, or biliary dilatation.  Pancreas: Unremarkable. No pancreatic ductal dilatation or surrounding inflammatory changes.  Spleen: Normal in size without focal abnormality.  Adrenals/Urinary Tract: The  adrenal glands and kidneys are normal. No hydronephrosis. Bladder is markedly compressed by the enlarged uterus.  Stomach/Bowel: Stomach is within normal limits. Appendix appears normal. No evidence of bowel wall thickening, distention, or inflammatory changes. The bowel is displaced out of the pelvis by the markedly enlarged uterus.  Vascular/Lymphatic: No significant vascular findings are present. No enlarged abdominal or pelvic lymph nodes.  Reproductive: The uterus is markedly enlarged with numerous fibroids, measuring 14.4 x 12.8 by 15.2 cm. The right ovary appears normal. There small compressed cysts on the left ovary. The uterus has markedly increased in size since the prior exam.  Other: There is a small midline abdominal hernia just above the umbilicus containing a small amount of peritoneal fat and fluid, measuring 26 x 16 mm. This has increased slightly in size since the prior study.  Musculoskeletal: No acute or significant osseous findings.  IMPRESSION: 1. Marked enlargement of the uterus with numerous fibroids, significantly increased since the prior study of 2016. The enlarged uterus has a mass effect upon the adjacent bowel and bladder. 2. Slight enlargement of a small midline abdominal hernia just above the umbilicus containing a small amount of peritoneal fat and fluid.   Electronically Signed   By: Lorriane Shire M.D.   On: 04/24/2019 16:18   04/04/2019 01/24/2018  6.1 5.2  4.64 4.64  13.1 13.0  38.2 38.7  82.4 83.5  28.2 27.9  34.2 33.4  14.5 14.6  336 342  8.1 8.4  36 41  55 49  9 9  1  0  0 1  2.2 2.1  3.3 2.6  0.5 0.5  0.0 0.0         Assessment/Plan Fibroids  Menorrhagia with regular cycle large fibroids symptomatic. Surgical options ( myomectomy, hysterectomy ), IR ( [place] ) ultrasonic treatment, hormonal management, expectant management discussed.  My advice would be [facility] or [facility] but she wants to wait to see if she can avoid  surgery. RTC 6 months or sooner if needed  >50% of 30 min face to face with review of records and coordination of care      Emeterio Reeve 05/11/2019, 4:21 PM

## 2019-05-11 NOTE — Progress Notes (Signed)
GYN presents for Fibroids.  C/o heavy bleeding and changing overnight pads every hour x 2-4 years

## 2019-05-13 ENCOUNTER — Other Ambulatory Visit: Payer: Self-pay

## 2019-05-13 ENCOUNTER — Ambulatory Visit
Admission: RE | Admit: 2019-05-13 | Discharge: 2019-05-13 | Disposition: A | Discharge status: Home / Self Care | Payer: BC Managed Care – PPO | Source: Ambulatory Visit | Attending: Obstetrics | Admitting: Obstetrics

## 2019-05-13 DIAGNOSIS — Z1239 Encounter for other screening for malignant neoplasm of breast: Secondary | ICD-10-CM

## 2019-05-16 ENCOUNTER — Other Ambulatory Visit: Payer: Self-pay | Admitting: Obstetrics

## 2019-05-16 DIAGNOSIS — R928 Other abnormal and inconclusive findings on diagnostic imaging of breast: Secondary | ICD-10-CM

## 2019-05-23 ENCOUNTER — Other Ambulatory Visit: Payer: Self-pay

## 2019-05-23 ENCOUNTER — Ambulatory Visit: Payer: BC Managed Care – PPO

## 2019-05-23 ENCOUNTER — Ambulatory Visit
Admission: RE | Admit: 2019-05-23 | Discharge: 2019-05-23 | Disposition: A | Discharge status: Home / Self Care | Payer: BC Managed Care – PPO | Source: Ambulatory Visit | Attending: Obstetrics | Admitting: Obstetrics

## 2019-05-23 DIAGNOSIS — R928 Other abnormal and inconclusive findings on diagnostic imaging of breast: Secondary | ICD-10-CM

## 2020-05-02 ENCOUNTER — Other Ambulatory Visit (HOSPITAL_COMMUNITY)
Admission: RE | Admit: 2020-05-02 | Discharge: 2020-05-02 | Disposition: A | Discharge status: Home / Self Care | Payer: BC Managed Care – PPO | Source: Ambulatory Visit | Attending: Obstetrics | Admitting: Obstetrics

## 2020-05-02 ENCOUNTER — Encounter: Payer: Self-pay | Admitting: Obstetrics

## 2020-05-02 ENCOUNTER — Other Ambulatory Visit: Payer: Self-pay

## 2020-05-02 ENCOUNTER — Ambulatory Visit (INDEPENDENT_AMBULATORY_CARE_PROVIDER_SITE_OTHER): Payer: BC Managed Care – PPO | Admitting: Obstetrics

## 2020-05-02 VITALS — BP 154/89 | HR 73 | Ht 65.0 in | Wt 160.8 lb

## 2020-05-02 DIAGNOSIS — N898 Other specified noninflammatory disorders of vagina: Secondary | ICD-10-CM | POA: Insufficient documentation

## 2020-05-02 DIAGNOSIS — Z01419 Encounter for gynecological examination (general) (routine) without abnormal findings: Secondary | ICD-10-CM | POA: Diagnosis present

## 2020-05-02 DIAGNOSIS — D219 Benign neoplasm of connective and other soft tissue, unspecified: Secondary | ICD-10-CM | POA: Diagnosis not present

## 2020-05-02 DIAGNOSIS — Z113 Encounter for screening for infections with a predominantly sexual mode of transmission: Secondary | ICD-10-CM

## 2020-05-02 DIAGNOSIS — Z1239 Encounter for other screening for malignant neoplasm of breast: Secondary | ICD-10-CM

## 2020-05-02 NOTE — Progress Notes (Signed)
Patient presents for Annual Exam. Patient complains of having itching all over her body, with a rash on her back.She does have an appt set up with her primary care to discuss on 12/13. Patient would like complete STD testing.    Last Pap 05/02/2019 Last MM 05/13/19 and 05/23/19 for follow up for dense breast and asymmetry.

## 2020-05-02 NOTE — Progress Notes (Signed)
Subjective:        Kayla Maldonado is a 49 y.o. female here for a routine exam.  Current complaints: Pelvic pressure from fibroids.  Vaginal discharge..    Personal health questionnaire:  Is patient [name], have a family history of breast and/or ovarian cancer: no Is there a family history of uterine cancer diagnosed at age < 63, gastrointestinal cancer, urinary tract cancer, family member who is a Field seismologist syndrome-associated carrier: no Is the patient overweight and hypertensive, family history of diabetes, personal history of gestational diabetes, preeclampsia or PCOS: no Is patient over 45, have PCOS,  family history of premature CHD under age 25, diabetes, smoke, have hypertension or peripheral artery disease:  no At any time, has a partner hit, kicked or otherwise hurt or frightened you?: no Over the past 2 weeks, have you felt down, depressed or hopeless?: no Over the past 2 weeks, have you felt little interest or pleasure in doing things?:no   Gynecologic History Patient's last menstrual period was 04/22/2020. Contraception: none Last Pap: 05-02-2019. Results were: normal Last mammogram: 05-13-2019. Results were: abnormal  Obstetric History OB History  Gravida Para Term Preterm AB Living  4 4 4     4   SAB TAB Ectopic Multiple Live Births          4    # Outcome Date GA Lbr Len/2nd Weight Sex Delivery Anes PTL Lv  4 Term 09/27/97    F Vag-Spont   LIV  3 Term 11/12/93    M Vag-Spont   LIV  2 Term 03/13/92    F Vag-Spont   LIV  1 Term 10/27/90    [name]    Past Medical History:  Diagnosis Date  . Vaginal delivery 1992, 1993, 1995, 1999    Past Surgical History:  Procedure Laterality Date  . DILATATION & CURRETTAGE/HYSTEROSCOPY WITH RESECTOCOPE N/A 06/14/2015   Procedure: RESECTION OF CERVICAL POLYP;  Surgeon: Shelly Bombard, MD;  Location: [facility];  Service: Gynecology;  Laterality: N/A;  . MOUTH SURGERY    . UPPER GASTROINTESTINAL ENDOSCOPY        Current Outpatient Medications:  Marland Kitchen  Multiple Vitamins-Minerals (AIRBORNE PO), Take 1 tablet by mouth daily. Reported on 07/11/2015, Disp: , Rfl:  Allergies  Allergen Reactions  . Codeine Itching    Social History   Tobacco Use  . Smoking status: Former Smoker    Quit date: 06/01/1997    Years since quitting: 22.9  . Smokeless tobacco: Never Used  Substance Use Topics  . Alcohol use: Yes    Alcohol/week: 0.0 standard drinks    Comment: occasionally    Family History  Problem Relation Age of Onset  . Diabetes Brother   . Breast cancer Neg Hx       Review of Systems  Constitutional: negative for fatigue and weight loss Respiratory: negative for cough and wheezing Cardiovascular: negative for chest pain, fatigue and palpitations Gastrointestinal: negative for abdominal pain and change in bowel habits Musculoskeletal:negative for myalgias Neurological: negative for gait problems and tremors Behavioral/Psych: negative for abusive relationship, depression Endocrine: negative for temperature intolerance    Genitourinary:positive for heavy 5 day menstrual periods and vaginal discharge.  Negative for genital lesions, hot flashes, sexual problems  Integument/breast: negative for breast lump, breast tenderness, nipple discharge and skin lesion(s)    Objective:       BP (!) 154/89   Pulse 73   Ht 5\' 5"  (1.651 m)  Wt 160 lb 12.8 oz (72.9 kg)   LMP 04/22/2020   BMI 26.76 kg/m  General:   alert and no distress  Skin:   no rash or abnormalities  Lungs:   clear to auscultation bilaterally  Heart:   regular rate and rhythm, S1, S2 normal, no murmur, click, rub or gallop  Breasts:   normal without suspicious masses, skin or nipple changes or axillary nodes  Abdomen:  normal findings: no organomegaly, soft, non-tender and no hernia  Pelvis:  External genitalia: normal general appearance Urinary system: urethral meatus normal and bladder without fullness, nontender Vaginal:  normal without tenderness, induration or masses Cervix: normal appearance Adnexa: normal bimanual exam Uterus: anteverted and non-tender, enlarged, irregular contour   Lab Review Urine pregnancy test Labs reviewed yes Radiologic studies reviewed yes  50% of 20 min visit spent on counseling and coordination of care.   Assessment:     1. Encounter for routine gynecological examination with Papanicolaou smear of cervix Rx: - Cytology - PAP( [place])  2. Fibroids - clinically stable except for pelvic pressure and heavy periods but normal duration and no pain, and she is not anemic.   - she declines any surgical intervention at this time  3. Vaginal discharge Rx: - Cervicovaginal ancillary only( [place])  4. Screening for STD (sexually transmitted disease) Rx: - Hepatitis B surface antigen - Hepatitis C antibody - HIV Antibody (routine testing w rflx) - RPR  5. Screening breast examination Rx: - MM Digital Screening; Future    Plan:    Education reviewed: calcium supplements, depression evaluation, low fat, low cholesterol diet, safe sex/STD prevention, self breast exams and weight bearing exercise. Contraception: none. Mammogram ordered. Follow up in: 1 year.    Orders Placed This Encounter  Procedures  . MM Digital Screening    Standing Status:   Future    Standing Expiration Date:   05/02/2021    Order Specific Question:   Reason for Exam (SYMPTOM  OR DIAGNOSIS REQUIRED)    Answer:   Screening    Order Specific Question:   Is the patient pregnant?    Answer:   No    Order Specific Question:   Preferred imaging location?    Answer:   [facility]  . Hepatitis B surface antigen  . Hepatitis C antibody  . HIV Antibody (routine testing w rflx)  . RPR    Shelly Bombard, MD 05/02/2020 11:57 AM

## 2020-05-03 LAB — CERVICOVAGINAL ANCILLARY ONLY
Bacterial Vaginitis (gardnerella): NEGATIVE
Candida Glabrata: NEGATIVE
Candida Vaginitis: NEGATIVE
Chlamydia: NEGATIVE
Comment: NEGATIVE
Comment: NEGATIVE
Comment: NEGATIVE
Comment: NEGATIVE
Comment: NEGATIVE
Comment: NORMAL
Neisseria Gonorrhea: NEGATIVE
Trichomonas: NEGATIVE

## 2020-05-03 LAB — RPR: RPR Ser Ql: NONREACTIVE

## 2020-05-03 LAB — HEPATITIS B SURFACE ANTIGEN: Hepatitis B Surface Ag: NEGATIVE

## 2020-05-03 LAB — HIV ANTIBODY (ROUTINE TESTING W REFLEX): HIV Screen 4th Generation wRfx: NONREACTIVE

## 2020-05-03 LAB — HEPATITIS C ANTIBODY: Hep C Virus Ab: <0.1 s/co ratio (ref 0.0–0.9)

## 2020-05-07 LAB — CYTOLOGY - PAP
Comment: NEGATIVE
Diagnosis: NEGATIVE
High risk HPV: NEGATIVE

## 2020-10-19 ENCOUNTER — Other Ambulatory Visit: Payer: Self-pay

## 2020-10-19 ENCOUNTER — Ambulatory Visit
Admission: RE | Admit: 2020-10-19 | Discharge: 2020-10-19 | Disposition: A | Discharge status: Home / Self Care | Payer: BC Managed Care – PPO | Source: Ambulatory Visit | Attending: Obstetrics | Admitting: Obstetrics

## 2020-10-19 DIAGNOSIS — Z1239 Encounter for other screening for malignant neoplasm of breast: Secondary | ICD-10-CM

## 2020-11-02 ENCOUNTER — Other Ambulatory Visit: Payer: Self-pay

## 2020-11-02 ENCOUNTER — Encounter (HOSPITAL_BASED_OUTPATIENT_CLINIC_OR_DEPARTMENT_OTHER): Payer: Self-pay | Admitting: Emergency Medicine

## 2020-11-02 ENCOUNTER — Emergency Department (HOSPITAL_BASED_OUTPATIENT_CLINIC_OR_DEPARTMENT_OTHER)
Admission: EM | Admit: 2020-11-02 | Discharge: 2020-11-02 | Disposition: A | Discharge status: Home / Self Care | Payer: BC Managed Care – PPO | Attending: Emergency Medicine | Admitting: Emergency Medicine

## 2020-11-02 DIAGNOSIS — R21 Rash and other nonspecific skin eruption: Secondary | ICD-10-CM | POA: Diagnosis present

## 2020-11-02 DIAGNOSIS — L509 Urticaria, unspecified: Secondary | ICD-10-CM | POA: Diagnosis not present

## 2020-11-02 DIAGNOSIS — Z87891 Personal history of nicotine dependence: Secondary | ICD-10-CM | POA: Diagnosis not present

## 2020-11-02 MED ORDER — HYDROXYZINE HCL 25 MG PO TABS
25.0000 mg | ORAL_TABLET | Freq: Once | ORAL | Status: AC
  Administered 2020-11-02: 25 mg via ORAL
  Filled 2020-11-02: qty 1

## 2020-11-02 MED ORDER — HYDROXYZINE HCL 25 MG PO TABS: 25.0000 mg | ORAL_TABLET | Freq: Four times a day (QID) | ORAL | 0 refills | Status: AC | PRN

## 2020-11-02 MED ORDER — PREDNISONE 20 MG PO TABS
40.0000 mg | ORAL_TABLET | Freq: Once | ORAL | Status: AC
  Administered 2020-11-02: 40 mg via ORAL
  Filled 2020-11-02: qty 2

## 2020-11-02 MED ORDER — EPINEPHRINE 0.3 MG/0.3ML IJ SOAJ
0.3000 mg | Freq: Once | INTRAMUSCULAR | Status: AC
  Administered 2020-11-02: 0.3 mg via INTRAMUSCULAR
  Filled 2020-11-02: qty 0.3

## 2020-11-02 MED ORDER — PREDNISONE 10 MG PO TABS: 20.0000 mg | ORAL_TABLET | Freq: Two times a day (BID) | ORAL | 0 refills | Status: AC

## 2020-11-02 NOTE — ED Provider Notes (Signed)
[facility] EMERGENCY DEPARTMENT Provider Note   CSN: [id number] Arrival date & time: 11/02/20  0546     History Chief Complaint  Patient presents with  . Urticaria    Kayla Maldonado is a 50 y.o. female.  Patient is a 50 year old female with past medical history of fibroids.  She presents today for evaluation of rash.  For the past 2 months, patient has had intermittent itching and hives of undetermined etiology.  She has been prescribed medication for this which does not seem to be helping.  This morning, she woke up with hives to her face, chest, and extremities that were much more significant.  She also feels somewhat scratchy in her throat.  She denies any difficulty breathing or swallowing.  The history is provided by the patient.       Past Medical History:  Diagnosis Date  . Vaginal delivery 1992, 1993, [place]    Patient Active Problem List   Diagnosis Date Noted  . Fibroids 05/11/2019  . Menorrhagia 05/11/2019  . LIPOMA 07/29/2006    Past Surgical History:  Procedure Laterality Date  . DILATATION & CURRETTAGE/HYSTEROSCOPY WITH RESECTOCOPE N/A 06/14/2015   Procedure: RESECTION OF CERVICAL POLYP;  Surgeon: Shelly Bombard, MD;  Location: [facility];  Service: Gynecology;  Laterality: N/A;  . HERNIA REPAIR    . MOUTH SURGERY    . UPPER GASTROINTESTINAL ENDOSCOPY       OB History    Gravida  4   Para  4   Term  4   Preterm      AB      Living  4     SAB      IAB      Ectopic      Multiple      Live Births  4           Family History  Problem Relation Age of Onset  . Diabetes Brother   . Hypertension Brother   . Kidney failure Brother   . Breast cancer Neg Hx     Social History   Tobacco Use  . Smoking status: Former Smoker    Quit date: 06/01/1997    Years since quitting: 23.4  . Smokeless tobacco: Never Used  Vaping Use  . Vaping Use: Never used  Substance Use Topics  . Alcohol use: Yes    Alcohol/week: 0.0  standard drinks    Comment: occasionally  . Drug use: No    Home Medications Prior to Admission medications   Medication Sig Start Date End Date Taking? Authorizing Provider  Multiple Vitamins-Minerals (AIRBORNE PO) Take 1 tablet by mouth daily. Reported on 07/11/2015    [provider]    Allergies    Codeine  Review of Systems   Review of Systems  All other systems reviewed and are negative.   Physical Exam Updated Vital Signs BP (!) 144/97 (BP Location: Right Arm)   Pulse 87   Temp 98.8 F (37.1 C) (Oral)   Resp 20   Ht 5\' 5"  (1.651 m)   Wt 70.3 kg   LMP 10/11/2020 (Exact Date)   SpO2 100%   BMI 25.79 kg/m   Physical Exam Vitals and nursing note reviewed.  Constitutional:      General: She is not in acute distress.    Appearance: She is well-developed. She is not diaphoretic.  HENT:     Head: Normocephalic and atraumatic.     Mouth/Throat:  Mouth: Mucous membranes are moist.     Pharynx: No oropharyngeal exudate or posterior oropharyngeal erythema.  Cardiovascular:     Rate and Rhythm: Normal rate and regular rhythm.     Heart sounds: No murmur heard. No friction rub. No gallop.   Pulmonary:     Effort: Pulmonary effort is normal. No respiratory distress.     Breath sounds: Normal breath sounds. No wheezing.  Abdominal:     General: Bowel sounds are normal. There is no distension.     Palpations: Abdomen is soft.     Tenderness: There is no abdominal tenderness.  Musculoskeletal:        General: Normal range of motion.     Cervical back: Normal range of motion and neck supple.  Skin:    General: Skin is warm and dry.     Findings: Rash present.     Comments: There is a generalized urticarial rash to the face, neck, torso, and all extremities.  Neurological:     Mental Status: She is alert and oriented to person, place, and time.     ED Results / Procedures / Treatments   Labs (all labs ordered are listed, but only abnormal results are  displayed) Labs Reviewed - No data to display  EKG None  Radiology No results found.  Procedures Procedures   Medications Ordered in ED Medications  EPINEPHrine (EPI-PEN) injection 0.3 mg (has no administration in time range)  predniSONE (DELTASONE) tablet 40 mg (has no administration in time range)  hydrOXYzine (ATARAX/VISTARIL) tablet 25 mg (has no administration in time range)    ED Course  I have reviewed the triage vital signs and the nursing notes.  Pertinent labs & imaging results that were available during my care of the patient were reviewed by me and considered in my medical decision making (see chart for details).    MDM Rules/Calculators/A&P  Patient here with urticarial rash of undetermined origin.  This has been waxing and waning for several weeks, but worsened tonight.  There are no signs or symptoms of anaphylaxis.  Patient given subcu epi along with prednisone and hydroxyzine.  She is doing somewhat better.  She will be discharged with prednisone and hydroxyzine and with instructions to follow-up with her doctor for referrals to allergy/immunology.  Final Clinical Impression(s) / ED Diagnoses Final diagnoses:  None    Rx / DC Orders ED Discharge Orders    None       Veryl Speak, MD 11/02/20 [phone]

## 2020-11-02 NOTE — Discharge Instructions (Addendum)
Begin taking prednisone and hydroxyzine as prescribed.  Follow-up with your primary doctor to discuss referrals to allergy and immunology for possible allergy testing.

## 2020-11-02 NOTE — ED Triage Notes (Signed)
Pt states she woke up this morning with hives and itching   Pt states her throat feels tight  Pt was seen about 3 weeks ago for same and was given medication to take daily but states it is not helping  Pt is taking allegra, famotidine and montelukast

## 2020-12-20 ENCOUNTER — Other Ambulatory Visit (HOSPITAL_COMMUNITY)
Admission: RE | Admit: 2020-12-20 | Discharge: 2020-12-20 | Disposition: A | Discharge status: Home / Self Care | Payer: BC Managed Care – PPO | Source: Ambulatory Visit | Attending: Obstetrics | Admitting: Obstetrics

## 2020-12-20 ENCOUNTER — Ambulatory Visit (INDEPENDENT_AMBULATORY_CARE_PROVIDER_SITE_OTHER): Payer: BC Managed Care – PPO | Admitting: Obstetrics

## 2020-12-20 ENCOUNTER — Encounter: Payer: Self-pay | Admitting: Obstetrics

## 2020-12-20 ENCOUNTER — Other Ambulatory Visit: Payer: Self-pay

## 2020-12-20 VITALS — BP 147/92 | HR 73 | Ht 65.0 in | Wt 160.6 lb

## 2020-12-20 DIAGNOSIS — N898 Other specified noninflammatory disorders of vagina: Secondary | ICD-10-CM

## 2020-12-20 DIAGNOSIS — B9689 Other specified bacterial agents as the cause of diseases classified elsewhere: Secondary | ICD-10-CM

## 2020-12-20 DIAGNOSIS — Z113 Encounter for screening for infections with a predominantly sexual mode of transmission: Secondary | ICD-10-CM | POA: Diagnosis not present

## 2020-12-20 DIAGNOSIS — N760 Acute vaginitis: Secondary | ICD-10-CM

## 2020-12-20 MED ORDER — METRONIDAZOLE 500 MG PO TABS: 500.0000 mg | ORAL_TABLET | Freq: Two times a day (BID) | ORAL | 2 refills | Status: DC

## 2020-12-20 NOTE — Progress Notes (Signed)
Pt is in the office for "light brown" vaginal discharge with odor and irritation. Last pap 05-02-2020

## 2020-12-20 NOTE — Progress Notes (Signed)
Patient ID: Kayla Maldonado, female   DOB: 06-27-1970, 50 y.o.   MRN: [id number]  Chief Complaint  Patient presents with   Vaginal Discharge    HPI Kayla Maldonado is a 50 y.o. female.  Complains of malodorous vaginal discharge.  Denies vaginal irritation. HPI  Past Medical History:  Diagnosis Date   Vaginal delivery 1992, 1993, 1995, 1999    Past Surgical History:  Procedure Laterality Date   DILATATION & CURRETTAGE/HYSTEROSCOPY WITH RESECTOCOPE N/A 06/14/2015   Procedure: RESECTION OF CERVICAL POLYP;  Surgeon: Shelly Bombard, MD;  Location: [facility];  Service: Gynecology;  Laterality: N/A;   HERNIA REPAIR     MOUTH SURGERY     UPPER GASTROINTESTINAL ENDOSCOPY      Family History  Problem Relation Age of Onset   Diabetes Brother    Hypertension Brother    Kidney failure Brother    Breast cancer Neg Hx     Social History Social History   Tobacco Use   Smoking status: Former    Types: Cigarettes    Quit date: 06/01/1997    Years since quitting: 23.5   Smokeless tobacco: Never  Vaping Use   Vaping Use: Never used  Substance Use Topics   Alcohol use: Yes    Alcohol/week: 0.0 standard drinks    Comment: occasionally   Drug use: No    Allergies  Allergen Reactions   Codeine Itching    Current Outpatient Medications  Medication Sig Dispense Refill   hydrOXYzine (ATARAX/VISTARIL) 25 MG tablet Take 1 tablet (25 mg total) by mouth every 6 (six) hours as needed. 15 tablet 0   metroNIDAZOLE (FLAGYL) 500 MG tablet Take 1 tablet (500 mg total) by mouth 2 (two) times daily. 14 tablet 2   Multiple Vitamins-Minerals (AIRBORNE PO) Take 1 tablet by mouth daily. Reported on 07/11/2015     predniSONE (DELTASONE) 10 MG tablet Take 2 tablets (20 mg total) by mouth 2 (two) times daily. (Patient not taking: Reported on 12/20/2020) 20 tablet 0   No current facility-administered medications for this visit.    Review of Systems Review of Systems Constitutional: negative for  fatigue and weight loss Respiratory: negative for cough and wheezing Cardiovascular: negative for chest pain, fatigue and palpitations Gastrointestinal: negative for abdominal pain and change in bowel habits Genitourinary:positive for malodorous vaginal discharge Integument/breast: negative for nipple discharge Musculoskeletal:negative for myalgias Neurological: negative for gait problems and tremors Behavioral/Psych: negative for abusive relationship, depression Endocrine: negative for temperature intolerance      Blood pressure (!) 147/92, pulse 73, height '5\' 5"'$  (1.651 m), weight 160 lb 9.6 oz (72.8 kg), last menstrual period 11/28/2020.  Physical Exam Physical Exam General:   Alert and no distress  Skin:   no rash or abnormalities  Lungs:   clear to auscultation bilaterally  Heart:   regular rate and rhythm, S1, S2 normal, no murmur, click, rub or gallop  Breasts:   normal without suspicious masses, skin or nipple changes or axillary nodes  Abdomen:  normal findings: no organomegaly, soft, non-tender and no hernia  Pelvis:  External genitalia: normal general appearance Urinary system: urethral meatus normal and bladder without fullness, nontender Vaginal: normal without tenderness, induration or masses Cervix: normal appearance Adnexa: normal bimanual exam Uterus: anteverted and non-tender, normal size    I have spent a total of 20 minutes of face-to-face time, excluding clinical staff time, reviewing notes and preparing to see patient, ordering tests and/or medications, and counseling the patient.  Data Reviewed Wet Prep Cultures  Assessment     1. Vaginal discharge Rx: - Cervicovaginal ancillary only( [place])  2. Screening for STD (sexually transmitted disease) Rx: - HIV Antibody (routine testing w rflx) - Hepatitis B surface antigen - RPR - Hepatitis C antibody  3. BV (bacterial vaginosis) Rx: - metroNIDAZOLE (FLAGYL) 500 MG tablet; Take 1 tablet (500 mg  total) by mouth 2 (two) times daily.  Dispense: 14 tablet; Refill: 2     Plan   Follow up prn  Orders Placed This Encounter  Procedures   HIV Antibody (routine testing w rflx)   Hepatitis B surface antigen   RPR   Hepatitis C antibody   Meds ordered this encounter  Medications   metroNIDAZOLE (FLAGYL) 500 MG tablet    Sig: Take 1 tablet (500 mg total) by mouth 2 (two) times daily.    Dispense:  14 tablet    Refill:  2      Shelly Bombard, MD 12/20/2020 9:16 AM

## 2020-12-21 LAB — HEPATITIS B SURFACE ANTIGEN: Hepatitis B Surface Ag: NEGATIVE

## 2020-12-21 LAB — HIV ANTIBODY (ROUTINE TESTING W REFLEX): HIV Screen 4th Generation wRfx: NONREACTIVE

## 2020-12-21 LAB — HEPATITIS C ANTIBODY: Hep C Virus Ab: 0.2 s/co ratio (ref 0.0–0.9)

## 2020-12-21 LAB — RPR: RPR Ser Ql: NONREACTIVE

## 2020-12-23 LAB — CERVICOVAGINAL ANCILLARY ONLY
Bacterial Vaginitis (gardnerella): POSITIVE — AB
Candida Glabrata: NEGATIVE
Candida Vaginitis: NEGATIVE
Chlamydia: NEGATIVE
Comment: NEGATIVE
Comment: NEGATIVE
Comment: NEGATIVE
Comment: NEGATIVE
Comment: NEGATIVE
Comment: NORMAL
Neisseria Gonorrhea: NEGATIVE
Trichomonas: NEGATIVE

## 2020-12-24 ENCOUNTER — Other Ambulatory Visit: Payer: Self-pay | Admitting: Obstetrics

## 2020-12-24 DIAGNOSIS — B3731 Acute candidiasis of vulva and vagina: Secondary | ICD-10-CM

## 2020-12-24 DIAGNOSIS — B373 Candidiasis of vulva and vagina: Secondary | ICD-10-CM

## 2020-12-24 MED ORDER — FLUCONAZOLE 150 MG PO TABS: 150.0000 mg | ORAL_TABLET | Freq: Once | ORAL | 0 refills | Status: AC

## 2021-11-06 ENCOUNTER — Other Ambulatory Visit: Payer: Self-pay | Admitting: Obstetrics

## 2021-11-06 DIAGNOSIS — Z1231 Encounter for screening mammogram for malignant neoplasm of breast: Secondary | ICD-10-CM

## 2021-11-18 ENCOUNTER — Ambulatory Visit
Admission: RE | Admit: 2021-11-18 | Discharge: 2021-11-18 | Disposition: A | Discharge status: Home / Self Care | Payer: BC Managed Care – PPO | Source: Ambulatory Visit | Attending: Obstetrics | Admitting: Obstetrics

## 2021-11-18 DIAGNOSIS — Z1231 Encounter for screening mammogram for malignant neoplasm of breast: Secondary | ICD-10-CM

## 2021-11-20 ENCOUNTER — Other Ambulatory Visit: Payer: Self-pay | Admitting: Obstetrics

## 2021-11-20 DIAGNOSIS — R928 Other abnormal and inconclusive findings on diagnostic imaging of breast: Secondary | ICD-10-CM

## 2021-11-26 ENCOUNTER — Ambulatory Visit
Admission: RE | Admit: 2021-11-26 | Discharge: 2021-11-26 | Disposition: A | Discharge status: Home / Self Care | Payer: BC Managed Care – PPO | Source: Ambulatory Visit | Attending: Obstetrics | Admitting: Obstetrics

## 2021-11-26 ENCOUNTER — Other Ambulatory Visit: Payer: Self-pay | Admitting: Obstetrics

## 2021-11-26 DIAGNOSIS — R928 Other abnormal and inconclusive findings on diagnostic imaging of breast: Secondary | ICD-10-CM

## 2021-11-26 DIAGNOSIS — N6310 Unspecified lump in the right breast, unspecified quadrant: Secondary | ICD-10-CM

## 2022-05-29 ENCOUNTER — Ambulatory Visit
Admission: RE | Admit: 2022-05-29 | Discharge: 2022-05-29 | Disposition: A | Discharge status: Home / Self Care | Payer: BC Managed Care – PPO | Source: Ambulatory Visit | Attending: Obstetrics | Admitting: Obstetrics

## 2022-05-29 ENCOUNTER — Other Ambulatory Visit: Payer: Self-pay | Admitting: Obstetrics

## 2022-05-29 DIAGNOSIS — N6310 Unspecified lump in the right breast, unspecified quadrant: Secondary | ICD-10-CM

## 2022-06-19 ENCOUNTER — Other Ambulatory Visit (HOSPITAL_COMMUNITY)
Admission: RE | Admit: 2022-06-19 | Discharge: 2022-06-19 | Disposition: A | Discharge status: Home / Self Care | Payer: BC Managed Care – PPO | Source: Ambulatory Visit | Attending: Obstetrics | Admitting: Obstetrics

## 2022-06-19 ENCOUNTER — Ambulatory Visit (INDEPENDENT_AMBULATORY_CARE_PROVIDER_SITE_OTHER): Payer: BC Managed Care – PPO | Admitting: Obstetrics

## 2022-06-19 ENCOUNTER — Encounter: Payer: Self-pay | Admitting: Obstetrics

## 2022-06-19 VITALS — BP 135/88 | HR 78 | Ht 65.0 in | Wt 162.1 lb

## 2022-06-19 DIAGNOSIS — N898 Other specified noninflammatory disorders of vagina: Secondary | ICD-10-CM | POA: Diagnosis present

## 2022-06-19 DIAGNOSIS — Z113 Encounter for screening for infections with a predominantly sexual mode of transmission: Secondary | ICD-10-CM

## 2022-06-19 DIAGNOSIS — Z01419 Encounter for gynecological examination (general) (routine) without abnormal findings: Secondary | ICD-10-CM | POA: Insufficient documentation

## 2022-06-19 DIAGNOSIS — E2839 Other primary ovarian failure: Secondary | ICD-10-CM

## 2022-06-19 DIAGNOSIS — B9689 Other specified bacterial agents as the cause of diseases classified elsewhere: Secondary | ICD-10-CM

## 2022-06-19 DIAGNOSIS — N939 Abnormal uterine and vaginal bleeding, unspecified: Secondary | ICD-10-CM

## 2022-06-19 MED ORDER — METRONIDAZOLE 500 MG PO TABS: 500.0000 mg | ORAL_TABLET | Freq: Two times a day (BID) | ORAL | 2 refills | Status: DC

## 2022-06-19 NOTE — Progress Notes (Signed)
Subjective:        Kayla Maldonado is a 52 y.o. female here for a routine exam.  Current complaints: Malodorous vaginal discharge.  Heavy periods and feeling tired.  Personal health questionnaire:  Is patient [name], have a family history of breast and/or ovarian cancer: no Is there a family history of uterine cancer diagnosed at age < 44, gastrointestinal cancer, urinary tract cancer, family member who is a Field seismologist syndrome-associated carrier: no Is the patient overweight and hypertensive, family history of diabetes, personal history of gestational diabetes, preeclampsia or PCOS: no Is patient over 34, have PCOS,  family history of premature CHD under age 28, diabetes, smoke, have hypertension or peripheral artery disease:  no At any time, has a partner hit, kicked or otherwise hurt or frightened you?: no Over the past 2 weeks, have you felt down, depressed or hopeless?: no Over the past 2 weeks, have you felt little interest or pleasure in doing things?:no   Gynecologic History Patient's last menstrual period was 06/05/2022. Contraception: none Last Pap: 2021. Results were: normal Last mammogram: 2023. Results were: normal  Obstetric History OB History  Gravida Para Term Preterm AB Living  '4 4 4     4  '$ SAB IAB Ectopic Multiple Live Births          4    # Outcome Date GA Lbr Len/2nd Weight Sex Delivery Anes PTL Lv  4 Term 09/27/97    F Vag-Spont   LIV  3 Term 11/12/93    M Vag-Spont   LIV  2 Term 03/13/92    F Vag-Spont   LIV  1 Term 10/27/90    [name]    Past Medical History:  Diagnosis Date   Vaginal delivery 1992, 1993, 1995, 1999    Past Surgical History:  Procedure Laterality Date   DILATATION & CURRETTAGE/HYSTEROSCOPY WITH RESECTOCOPE N/A 06/14/2015   Procedure: RESECTION OF CERVICAL POLYP;  Surgeon: Shelly Bombard, MD;  Location: [facility];  Service: Gynecology;  Laterality: N/A;   HERNIA REPAIR     MOUTH SURGERY     UPPER  GASTROINTESTINAL ENDOSCOPY       Current Outpatient Medications:    hydrOXYzine (ATARAX/VISTARIL) 25 MG tablet, Take 1 tablet (25 mg total) by mouth every 6 (six) hours as needed. (Patient not taking: Reported on 06/19/2022), Disp: 15 tablet, Rfl: 0   metroNIDAZOLE (FLAGYL) 500 MG tablet, Take 1 tablet (500 mg total) by mouth 2 (two) times daily. (Patient not taking: Reported on 06/19/2022), Disp: 14 tablet, Rfl: 2   Multiple Vitamins-Minerals (AIRBORNE PO), Take 1 tablet by mouth daily. Reported on 07/11/2015 (Patient not taking: Reported on 06/19/2022), Disp: , Rfl:    predniSONE (DELTASONE) 10 MG tablet, Take 2 tablets (20 mg total) by mouth 2 (two) times daily. (Patient not taking: Reported on 12/20/2020), Disp: 20 tablet, Rfl: 0 Allergies  Allergen Reactions   Codeine Itching    Social History   Tobacco Use   Smoking status: Former    Types: Cigarettes    Quit date: 06/01/1997    Years since quitting: 25.0    Passive exposure: Never   Smokeless tobacco: Never  Substance Use Topics   Alcohol use: Yes    Alcohol/week: 0.0 standard drinks of alcohol    Comment: occasionally    Family History  Problem Relation Age of Onset   Diabetes Brother    Hypertension Brother    Kidney failure Brother    Breast  cancer Neg Hx       Review of Systems  Constitutional: negative for fatigue and weight loss Respiratory: negative for cough and wheezing Cardiovascular: negative for chest pain, fatigue and palpitations Gastrointestinal: negative for abdominal pain and change in bowel habits Musculoskeletal:negative for myalgias Neurological: negative for gait problems and tremors Behavioral/Psych: negative for abusive relationship, depression Endocrine: negative for temperature intolerance    Genitourinary: positive for malodorous vaginal discharge.  negative for abnormal menstrual periods, genital lesions, hot flashes, sexual problems  Integument/breast: negative for breast lump, breast  tenderness, nipple discharge and skin lesion(s)    Objective:       BP 135/88   Pulse 78   Ht '5\' 5"'$  (1.651 m)   Wt 162 lb 1.6 oz (73.5 kg)   LMP 06/05/2022   BMI 26.97 kg/m  General:   Alert and no distress  Skin:   no rash or abnormalities  Lungs:   clear to auscultation bilaterally  Heart:   regular rate and rhythm, S1, S2 normal, no murmur, click, rub or gallop  Breasts:   normal without suspicious masses, skin or nipple changes or axillary nodes  Abdomen:  normal findings: no organomegaly, soft, non-tender and no hernia  Pelvis:  External genitalia: normal general appearance Urinary system: urethral meatus normal and bladder without fullness, nontender Vaginal: normal without tenderness, induration or masses Cervix: normal appearance Adnexa: normal bimanual exam Uterus: anteverted and non-tender, enlarged, irregular   Lab Review Urine pregnancy test Labs reviewed yes Radiologic studies reviewed yes  I have spent a total of 20 minutes of face-to-face time, excluding clinical staff time, reviewing notes and preparing to see patient, ordering tests and/or medications, and counseling the patient.   Assessment:    1. Encounter for gynecological examination with Papanicolaou smear of cervix Rx: - Cytology - PAP( [place]) - TSH - CBC - Ferritin  2. Abnormal uterine bleeding (AUB) Rx: - US PELVIC COMPLETE WITH TRANSVAGINAL; Future  3. Vaginal discharge Rx: - Cervicovaginal ancillary only( [place])  4. Screen for STD (sexually transmitted disease) Rx: - Hepatitis B surface antigen - Hepatitis C antibody - RPR - HIV Antibody (routine testing w rflx)  5. BV (bacterial vaginosis) Rx: - metroNIDAZOLE (FLAGYL) 500 MG tablet; Take 1 tablet (500 mg total) by mouth 2 (two) times daily.  Dispense: 14 tablet; Refill: 2  6. Hypoestrogenism Rx: - DG BONE DENSITY (DXA); Future     Plan:    Education reviewed: calcium supplements, depression evaluation, low  fat, low cholesterol diet, safe sex/STD prevention, self breast exams, and weight bearing exercise. Follow up in: 2 years.   No orders of the defined types were placed in this encounter.  Orders Placed This Encounter  Procedures   Hepatitis B surface antigen   Hepatitis C antibody   RPR   HIV Antibody (routine testing w rflx)     Jonmarc Bodkin A. Jodi Mourning MD 06/19/2022

## 2022-06-19 NOTE — Progress Notes (Signed)
Pt presents for annual and pap smear.  Pt reports malodorous vaginal discharge.  Mammogram 05/29/22 Colonoscopy June or July 2023

## 2022-06-20 LAB — CBC
Hematocrit: 37.5 % (ref 34.0–46.6)
Hemoglobin: 12.4 g/dL (ref 11.1–15.9)
MCH: 26.2 pg — ABNORMAL LOW (ref 26.6–33.0)
MCHC: 33.1 g/dL (ref 31.5–35.7)
MCV: 79 fL (ref 79–97)
Platelets: 354 10*3/uL (ref 150–450)
RBC: 4.73 x10E6/uL (ref 3.77–5.28)
RDW: 13.2 % (ref 11.7–15.4)
WBC: 3.9 10*3/uL (ref 3.4–10.8)

## 2022-06-20 LAB — TSH: TSH: 1.71 u[IU]/mL (ref 0.450–4.500)

## 2022-06-20 LAB — FERRITIN: Ferritin: 26 ng/mL (ref 15–150)

## 2022-06-20 LAB — RPR: RPR Ser Ql: NONREACTIVE

## 2022-06-20 LAB — HEPATITIS B SURFACE ANTIGEN: Hepatitis B Surface Ag: NEGATIVE

## 2022-06-20 LAB — HIV ANTIBODY (ROUTINE TESTING W REFLEX): HIV Screen 4th Generation wRfx: NONREACTIVE

## 2022-06-20 LAB — HEPATITIS C ANTIBODY: Hep C Virus Ab: NONREACTIVE

## 2022-06-22 LAB — CERVICOVAGINAL ANCILLARY ONLY
Bacterial Vaginitis (gardnerella): NEGATIVE
Candida Glabrata: NEGATIVE
Candida Vaginitis: NEGATIVE
Chlamydia: NEGATIVE
Comment: NEGATIVE
Comment: NEGATIVE
Comment: NEGATIVE
Comment: NEGATIVE
Comment: NEGATIVE
Comment: NORMAL
Neisseria Gonorrhea: NEGATIVE
Trichomonas: NEGATIVE

## 2022-06-25 LAB — CYTOLOGY - PAP
Adequacy: ABSENT
Comment: NEGATIVE
Diagnosis: NEGATIVE
High risk HPV: NEGATIVE

## 2022-07-03 ENCOUNTER — Telehealth: Payer: BC Managed Care – PPO | Admitting: Obstetrics

## 2022-07-10 ENCOUNTER — Ambulatory Visit
Admission: RE | Admit: 2022-07-10 | Discharge: 2022-07-10 | Disposition: A | Discharge status: Home / Self Care | Payer: BC Managed Care – PPO | Source: Ambulatory Visit | Attending: Obstetrics | Admitting: Obstetrics

## 2022-07-10 DIAGNOSIS — N939 Abnormal uterine and vaginal bleeding, unspecified: Secondary | ICD-10-CM

## 2022-07-17 ENCOUNTER — Encounter: Payer: Self-pay | Admitting: Obstetrics

## 2022-07-17 ENCOUNTER — Telehealth (INDEPENDENT_AMBULATORY_CARE_PROVIDER_SITE_OTHER): Payer: BC Managed Care – PPO | Admitting: Obstetrics

## 2022-07-17 DIAGNOSIS — D259 Leiomyoma of uterus, unspecified: Secondary | ICD-10-CM | POA: Diagnosis not present

## 2022-07-17 DIAGNOSIS — N939 Abnormal uterine and vaginal bleeding, unspecified: Secondary | ICD-10-CM | POA: Diagnosis not present

## 2022-07-17 NOTE — Progress Notes (Unsigned)
GYNECOLOGY VIRTUAL VISIT ENCOUNTER NOTE  Provider location: [facility] for [place] at {[facility]ank single:[id number]::"[facility]","[facility]","[facility]","[facility]"}   Patient location: Home  I connected with Kayla Maldonado on 07/17/22 at 10:55 AM EST by MyChart Video Encounter and verified that I am speaking with the correct person using two identifiers.   I discussed the limitations, risks, security and privacy concerns of performing an evaluation and management service virtually and the availability of in person appointments. I also discussed with the patient that there may be a patient responsible charge related to this service. The patient expressed understanding and agreed to proceed.   History:  Kayla Maldonado is a 52 y.o. [phone] female being evaluated today for ***. She denies any abnormal vaginal discharge, bleeding, pelvic pain or other concerns.       Past Medical History:  Diagnosis Date   Vaginal delivery 1992, 1993, 1995, 1999   Past Surgical History:  Procedure Laterality Date   DILATATION & CURRETTAGE/HYSTEROSCOPY WITH RESECTOCOPE N/A 06/14/2015   Procedure: RESECTION OF CERVICAL POLYP;  Surgeon: Shelly Bombard, MD;  Location: [facility];  Service: Gynecology;  Laterality: N/A;   HERNIA REPAIR     MOUTH SURGERY     UPPER GASTROINTESTINAL ENDOSCOPY     The following portions of the patient's history were reviewed and updated as appropriate: allergies, current medications, past family history, past medical history, past social history, past surgical history and problem list.   Health Maintenance:  Normal pap and negative HRHPV on ***.  Normal mammogram on ***.   Review of Systems:  Pertinent items noted in HPI and remainder of comprehensive ROS otherwise negative.  Physical Exam:   General:  Alert, oriented and cooperative. Patient appears to be in no acute distress.  Mental  Status: Normal mood and affect. Normal behavior. Normal judgment and thought content.   Respiratory: Normal respiratory effort, no problems with respiration noted  Rest of physical exam deferred due to type of encounter  Labs and Imaging No results found for this or any previous visit (from the past 336 hour(s)). US PELVIC COMPLETE WITH TRANSVAGINAL  Result Date: 07/10/2022 CLINICAL DATA:  Abnormal uterine bleeding, history of fibroids, LMP 07/05/2022 EXAM: TRANSABDOMINAL AND TRANSVAGINAL ULTRASOUND OF PELVIS TECHNIQUE: Both transabdominal and transvaginal ultrasound examinations of the pelvis were performed. Transabdominal technique was performed for global imaging of the pelvis including uterus, ovaries, adnexal regions, and pelvic cul-de-sac. It was necessary to proceed with endovaginal exam following the transabdominal exam to visualize the ovaries. COMPARISON:  04/07/2017 FINDINGS: Uterus Measurements: 17.1 x 10.5 x 17.1 cm = volume: 1609 mL. Enlarged uterus with heterogeneous myometrium. Uterus extends to umbilicus. Multiple masses are seen, limiting assessment. These include a fundal 7.9 cm diameter mass, anterior 8.6 cm mass, and posterior RIGHT lateral 7.2 cm mass. These are consistent with multiple leiomyomata. Smaller leiomyomata are also present. Endometrium Thickness: 5 mm. Small amount of endometrial fluid. No obvious mass. Right ovary Not visualized, likely obscured by bowel Left ovary Not visualized, likely obscured by bowel Other findings No free pelvic fluid or adnexal masses. IMPRESSION: Nonvisualization of ovaries. Enlarged uterus containing multiple leiomyomata up to 8.6 cm diameter. Small amount of nonspecific endometrial fluid. Electronically Signed   By: Lavonia Dana M.D.   On: 07/10/2022 11:56       Assessment and Plan:     There are no diagnoses linked to this encounter.      I discussed the assessment and treatment plan with  the patient. The patient was provided an opportunity  to ask questions and all were answered. The patient agreed with the plan and demonstrated an understanding of the instructions.   The patient was advised to call back or seek an in-person evaluation/go to the ED if the symptoms worsen or if the condition fails to improve as anticipated.  I provided *** minutes of face-to-face time during this encounter.   Baltazar Najjar, MD [facility] for [facility], [place], [facility] 07/17/22

## 2022-07-20 IMAGING — MG MM DIGITAL SCREENING BILAT W/ TOMO AND CAD
8 series · 8 of 24 positions shown · non-contrast
Comparison: Previous exam(s).

CLINICAL DATA: Screening.

EXAM:
DIGITAL SCREENING BILATERAL MAMMOGRAM WITH TOMOSYNTHESIS AND CAD
TECHNIQUE: Bilateral screening digital craniocaudal and mediolateral oblique
mammograms were obtained. Bilateral screening digital breast
tomosynthesis was performed. The images were evaluated with
computer-aided detection.

[L CC synth-2D]
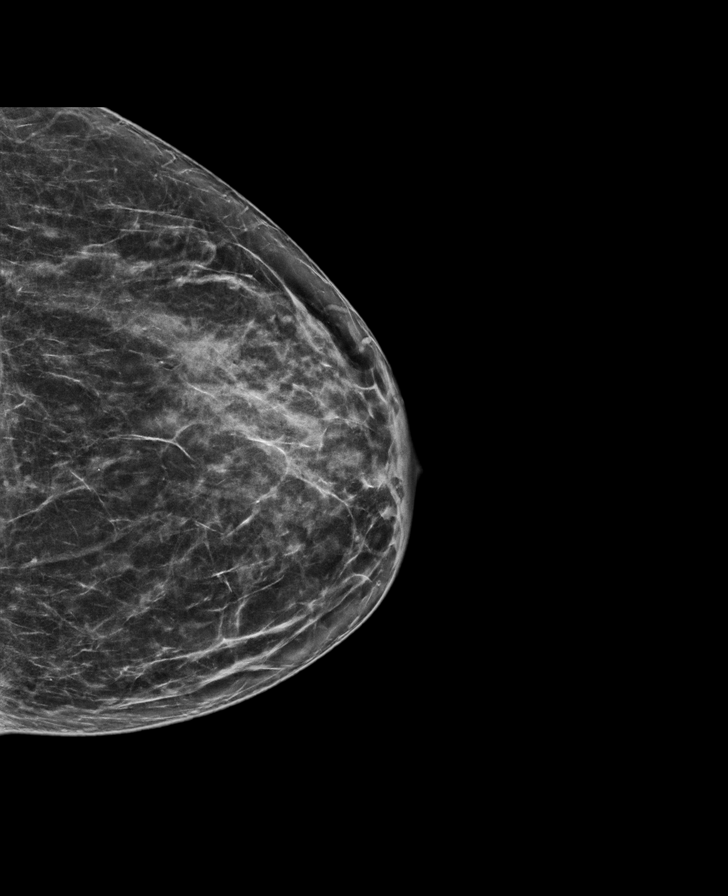

[R MLO synth-2D]
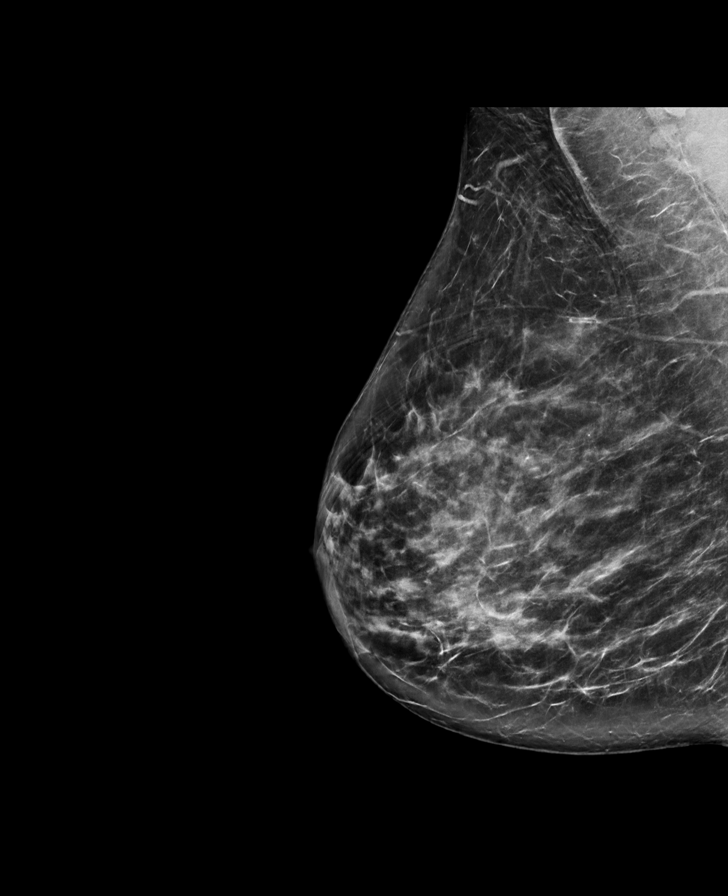

[L MLO synth-2D]
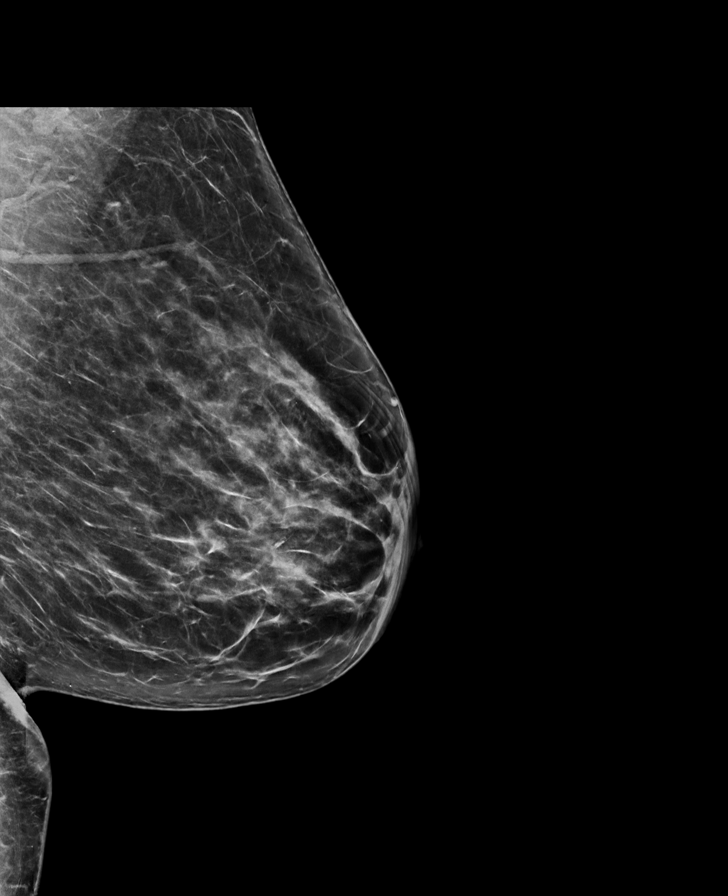

[R CC synth-2D]
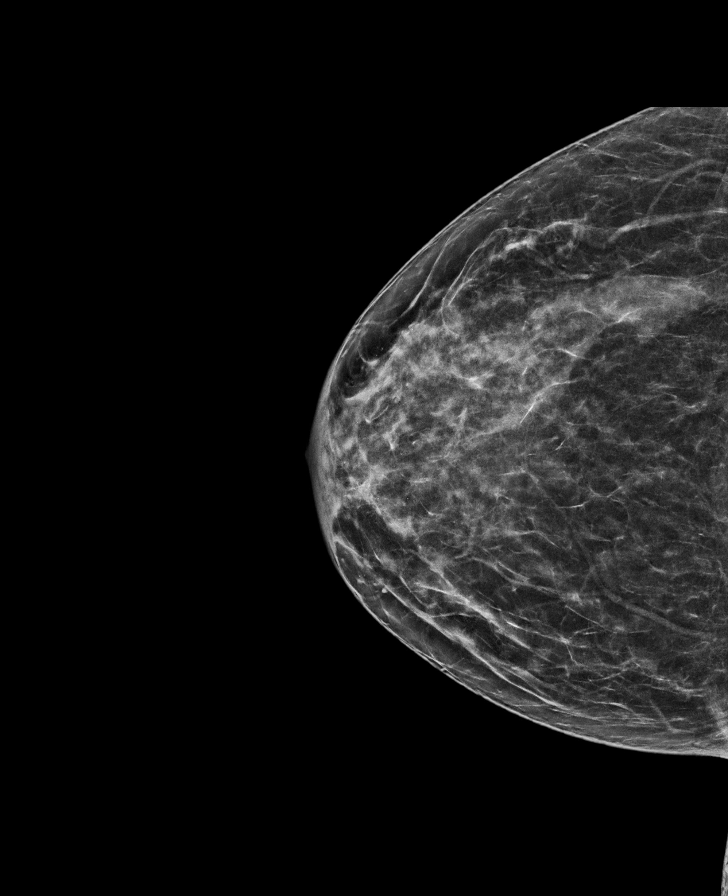

[L MLO tomo · tomo slice 41/80.0]
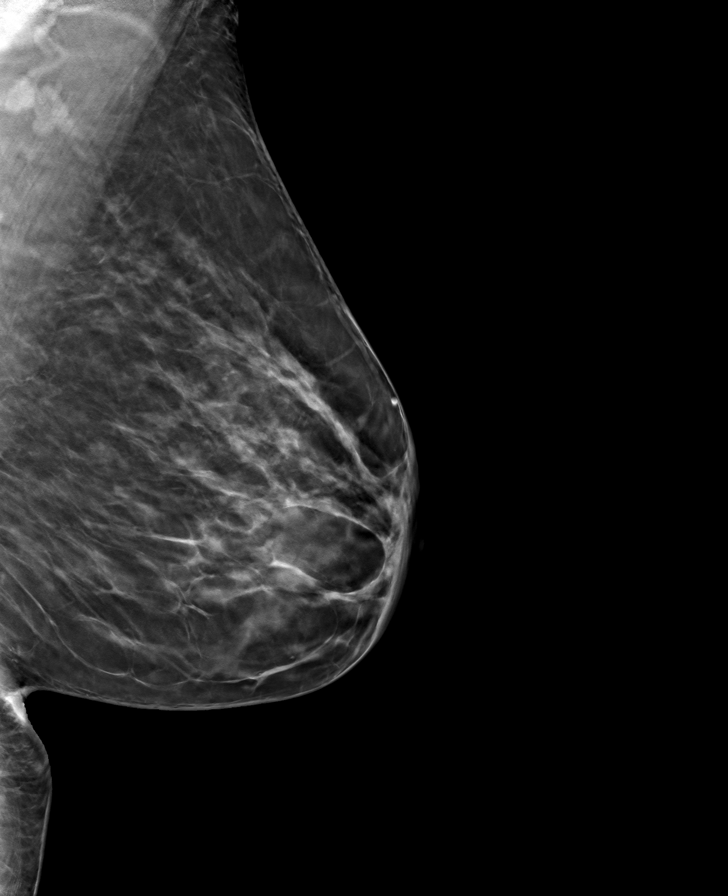

[R CC tomo · tomo slice 33/66.0]
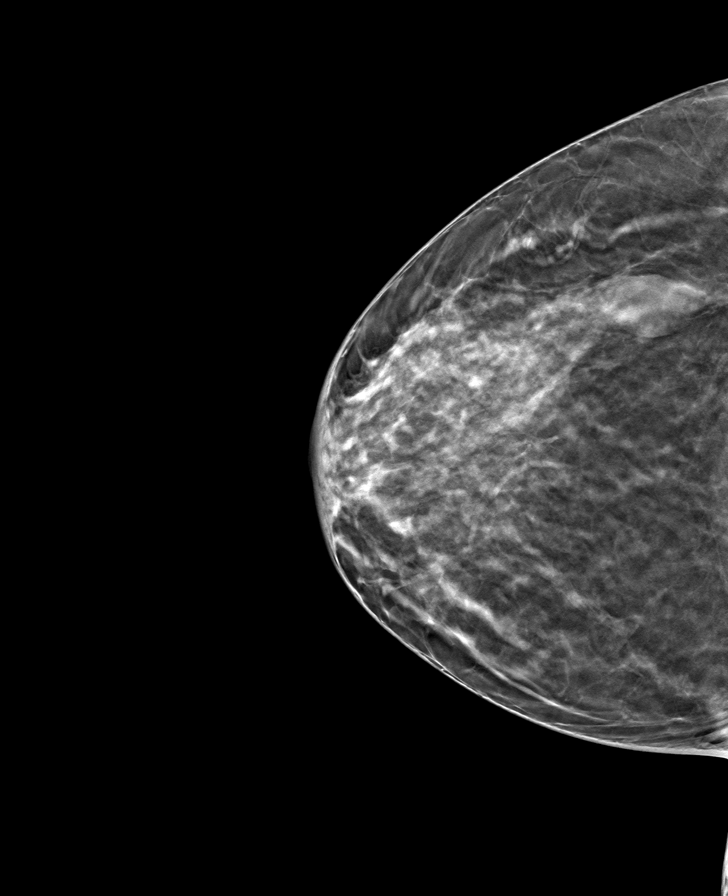

[L CC tomo · tomo slice 35/68.0]
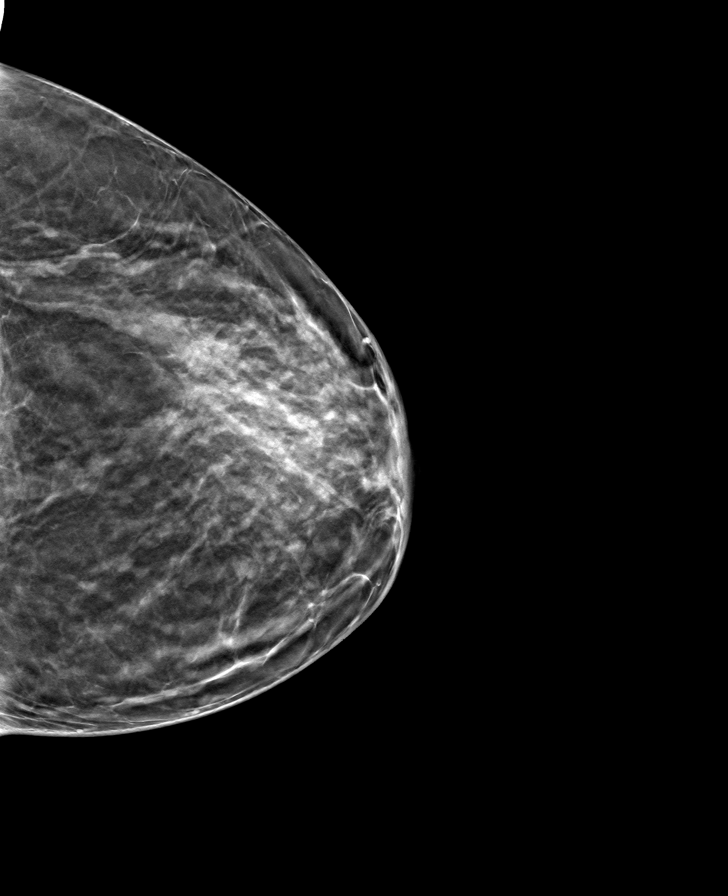

[R MLO tomo · tomo slice 43/85.0]
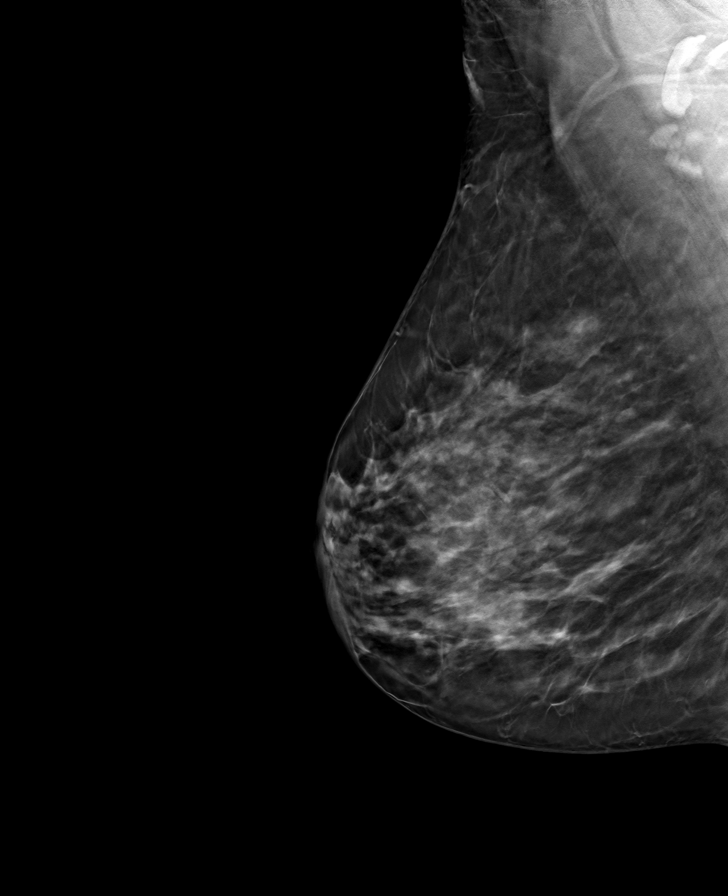

[8 of 24 positions shown; findings below may reference images not displayed]

ACR Breast Density Category c: The breast tissue is heterogeneously
dense, which may obscure small masses.
FINDINGS: In the right breast, a possible mass warrants further evaluation.
This possible mass is seen within the outer RIGHT breast, cc slice
35. In the left breast, no findings suspicious for malignancy.
IMPRESSION: Further evaluation is suggested for possible mass in the right
breast.

RECOMMENDATION:
Ultrasound of the right breast. (Code:93-9-AAI)

The patient will be contacted regarding the findings, and additional
imaging will be scheduled.

BI-RADS CATEGORY  0: Incomplete. Need additional imaging evaluation
and/or prior mammograms for comparison.

## 2022-08-31 ENCOUNTER — Other Ambulatory Visit: Payer: BC Managed Care – PPO

## 2022-11-27 ENCOUNTER — Ambulatory Visit
Admission: RE | Admit: 2022-11-27 | Discharge: 2022-11-27 | Disposition: A | Discharge status: Home / Self Care | Payer: BC Managed Care – PPO | Source: Ambulatory Visit | Attending: Obstetrics | Admitting: Obstetrics

## 2022-11-27 ENCOUNTER — Other Ambulatory Visit: Payer: Self-pay | Admitting: Obstetrics

## 2022-11-27 DIAGNOSIS — N6310 Unspecified lump in the right breast, unspecified quadrant: Secondary | ICD-10-CM

## 2022-12-04 ENCOUNTER — Other Ambulatory Visit: Payer: BC Managed Care – PPO

## 2023-02-18 ENCOUNTER — Other Ambulatory Visit: Payer: Self-pay | Admitting: Obstetrics

## 2023-02-18 DIAGNOSIS — E2839 Other primary ovarian failure: Secondary | ICD-10-CM

## 2023-02-19 ENCOUNTER — Other Ambulatory Visit: Payer: BC Managed Care – PPO

## 2023-09-03 ENCOUNTER — Ambulatory Visit
Admission: RE | Admit: 2023-09-03 | Discharge: 2023-09-03 | Disposition: A | Discharge status: Home / Self Care | Payer: BC Managed Care – PPO | Source: Ambulatory Visit | Attending: Obstetrics | Admitting: Obstetrics

## 2023-09-03 DIAGNOSIS — E2839 Other primary ovarian failure: Secondary | ICD-10-CM

## 2023-10-11 ENCOUNTER — Encounter: Payer: Self-pay | Admitting: Advanced Practice Midwife

## 2023-10-11 ENCOUNTER — Other Ambulatory Visit (HOSPITAL_COMMUNITY)
Admission: RE | Admit: 2023-10-11 | Discharge: 2023-10-11 | Disposition: A | Discharge status: Home / Self Care | Source: Ambulatory Visit | Attending: Advanced Practice Midwife | Admitting: Advanced Practice Midwife

## 2023-10-11 ENCOUNTER — Ambulatory Visit: Admitting: Advanced Practice Midwife

## 2023-10-11 VITALS — BP 142/91 | HR 80 | Ht 65.0 in | Wt 160.2 lb

## 2023-10-11 DIAGNOSIS — R232 Flushing: Secondary | ICD-10-CM | POA: Insufficient documentation

## 2023-10-11 DIAGNOSIS — N898 Other specified noninflammatory disorders of vagina: Secondary | ICD-10-CM | POA: Diagnosis not present

## 2023-10-11 DIAGNOSIS — B372 Candidiasis of skin and nail: Secondary | ICD-10-CM | POA: Diagnosis not present

## 2023-10-11 MED ORDER — NYSTATIN 100000 UNIT/GM EX CREA: 1.0000 | TOPICAL_CREAM | Freq: Two times a day (BID) | CUTANEOUS | 1 refills | Status: AC

## 2023-10-11 NOTE — Progress Notes (Addendum)
 Pt reports vagina irritation and burning for 2-3 weeks. No OTC meds.

## 2023-10-11 NOTE — Progress Notes (Signed)
   GYNECOLOGY PROGRESS NOTE  History:  53 y.o. [id number] presents to [facility] office today for problem gyn visit. She reports onset of itching in the groin and mons area, not inside the vagina.  The itching and irritation started 2 weeks ago. She has not tried any OTC medications.  Pt is currently on her menstrual cycle and reports some irregular cycles recently, but she is still having cycles.  She does have hot flashes which can be bothersome.  She denies h/a, dizziness, shortness of breath, n/v, or fever/chills.    The following portions of the patient's history were reviewed and updated as appropriate: allergies, current medications, past family history, past medical history, past social history, past surgical history and problem list. Last pap smear on 06/19/22 was normal, negative HRHPV.  Health Maintenance Due  Topic Date Due   DTaP/Tdap/Td (2 - Td or Tdap) 04/01/1998   Colonoscopy  Never done   Zoster Vaccines- Shingrix (1 of 2) Never done   COVID-19 Vaccine (3 - 2024-25 season) 01/31/2023     Review of Systems:  Pertinent items are noted in HPI.   Objective:  Physical Exam Blood pressure (!) 142/91, pulse 80, height 5\' 5"  (1.651 m), weight 160 lb 3.2 oz (72.7 kg). VS reviewed, nursing note reviewed,  Constitutional: well developed, well nourished, no distress HEENT: normocephalic CV: normal rate Pulm/chest wall: normal effort Breast Exam: deferred Abdomen: soft Neuro: alert and oriented x 3 Skin: warm, dry Psych: affect normal Pelvic exam: On visual inspection, mild flaking of skin in groin area, c/w candida.  No visible rash, edema, or erythema in areas with itching.  Vaginal swab collected by CNM as blind swab in office today.  Assessment & Plan:  1. Skin candidiasis (Primary) --Pt symptoms and visual exam c/w yeast. Will treat topically externally. No vaginal applicator medication unless vaginal swab is also positive.  - nystatin cream (MYCOSTATIN); Apply 1  Application topically 2 (two) times daily.  Dispense: 30 g; Refill: 1  2. Vaginal itching  - Cervicovaginal ancillary only( [place])  3. Hot flashes --Discussed options, will start with conservative treatments.  Mychart message sent with food/herbal remedies to try.  Pt to f/u with Dr April Knack for annual as scheduled.     Arlester Bence, CNM 12:49 PM

## 2023-10-11 NOTE — Addendum Note (Signed)
 Addended by: Arlester Bence A on: 10/11/2023 01:15 PM   Modules accepted: Orders

## 2023-10-12 ENCOUNTER — Ambulatory Visit: Payer: Self-pay | Admitting: Advanced Practice Midwife

## 2023-10-12 DIAGNOSIS — N3000 Acute cystitis without hematuria: Secondary | ICD-10-CM

## 2023-10-12 LAB — CERVICOVAGINAL ANCILLARY ONLY
Bacterial Vaginitis (gardnerella): POSITIVE — AB
Candida Glabrata: NEGATIVE
Candida Vaginitis: NEGATIVE
Chlamydia: NEGATIVE
Comment: NEGATIVE
Comment: NEGATIVE
Comment: NEGATIVE
Comment: NEGATIVE
Comment: NEGATIVE
Comment: NORMAL
Neisseria Gonorrhea: NEGATIVE
Trichomonas: NEGATIVE

## 2023-10-12 MED ORDER — METRONIDAZOLE 500 MG PO TABS: 500.0000 mg | ORAL_TABLET | Freq: Two times a day (BID) | ORAL | 0 refills | Status: AC

## 2023-10-12 MED ORDER — FLUCONAZOLE 150 MG PO TABS: 150.0000 mg | ORAL_TABLET | Freq: Once | ORAL | 0 refills | Status: AC

## 2023-10-14 LAB — URINE CULTURE

## 2023-10-15 MED ORDER — CEPHALEXIN 500 MG PO CAPS: 500.0000 mg | ORAL_CAPSULE | Freq: Two times a day (BID) | ORAL | 0 refills | Status: AC

## 2023-10-22 ENCOUNTER — Encounter: Payer: Self-pay | Admitting: Obstetrics

## 2023-10-22 ENCOUNTER — Ambulatory Visit: Admitting: Obstetrics

## 2023-10-22 ENCOUNTER — Other Ambulatory Visit (HOSPITAL_COMMUNITY)
Admission: RE | Admit: 2023-10-22 | Discharge: 2023-10-22 | Disposition: A | Discharge status: Home / Self Care | Source: Ambulatory Visit | Attending: Obstetrics | Admitting: Obstetrics

## 2023-10-22 VITALS — BP 170/94 | HR 64 | Ht 65.0 in | Wt 157.4 lb

## 2023-10-22 DIAGNOSIS — Z1331 Encounter for screening for depression: Secondary | ICD-10-CM | POA: Diagnosis not present

## 2023-10-22 DIAGNOSIS — Z01419 Encounter for gynecological examination (general) (routine) without abnormal findings: Secondary | ICD-10-CM | POA: Insufficient documentation

## 2023-10-22 DIAGNOSIS — R232 Flushing: Secondary | ICD-10-CM

## 2023-10-22 DIAGNOSIS — D259 Leiomyoma of uterus, unspecified: Secondary | ICD-10-CM | POA: Diagnosis not present

## 2023-10-22 DIAGNOSIS — Z113 Encounter for screening for infections with a predominantly sexual mode of transmission: Secondary | ICD-10-CM

## 2023-10-22 MED ORDER — PNV-DHA+DOCUSATE 27-1.25-300 MG PO CAPS: 1.0000 | ORAL_CAPSULE | Freq: Every day | ORAL | 4 refills | Status: AC

## 2023-10-22 NOTE — Progress Notes (Signed)
 Pt presents for annual. Pt 1st bp reading 169/95 2nd reading 170/94. Pt has no other questions or concerns at this time.

## 2023-10-22 NOTE — Progress Notes (Signed)
 Subjective:        Kayla Maldonado is a 53 y.o. female here for a routine exam.  Current complaints: Vaginal discharge and hot flashes..    Personal health questionnaire:  Is patient [name], have a family history of breast and/or ovarian cancer: no Is there a family history of uterine cancer diagnosed at age < 22, gastrointestinal cancer, urinary tract cancer, family member who is a Personnel officer syndrome-associated carrier: no Is the patient overweight and hypertensive, family history of diabetes, personal history of gestational diabetes, preeclampsia or PCOS: no Is patient over 53, have PCOS,  family history of premature CHD under age 33, diabetes, smoke, have hypertension or peripheral artery disease:  no At any time, has a partner hit, kicked or otherwise hurt or frightened you?: no Over the past 2 weeks, have you felt down, depressed or hopeless?: no Over the past 2 weeks, have you felt little interest or pleasure in doing things?:no   Gynecologic History Patient's last menstrual period was 10/08/2023 (exact date). Contraception: none Last Pap: 2023. Results were: normal Last mammogram: 2023. Results were: normal  Obstetric History OB History  Gravida Para Term Preterm AB Living  4 4 4   4   SAB IAB Ectopic Multiple Live Births      4    # Outcome Date GA Lbr Len/2nd Weight Sex Type Anes PTL Lv  4 Term 09/27/97    F Vag-Spont   LIV  3 Term 11/12/93    M Vag-Spont   LIV  2 Term 03/13/92    F Vag-Spont   LIV  1 Term 10/27/90    Augie Leaven    Past Medical History:  Diagnosis Date   Vaginal delivery 1992, 1993, 1995, 1999    Past Surgical History:  Procedure Laterality Date   DILATATION & CURRETTAGE/HYSTEROSCOPY WITH RESECTOCOPE N/A 06/14/2015   Procedure: RESECTION OF CERVICAL POLYP;  Surgeon: Gabrielle Joiner, MD;  Location: [facility];  Service: Gynecology;  Laterality: N/A;   HERNIA REPAIR     MOUTH SURGERY     UPPER GASTROINTESTINAL ENDOSCOPY        Current Outpatient Medications:    cephALEXin  (KEFLEX ) 500 MG capsule, Take 1 capsule (500 mg total) by mouth 2 (two) times daily for 7 days., Disp: 14 capsule, Rfl: 0   Prenat-FeFum-DSS-FA-DHA w/o A (PNV-DHA+DOCUSATE) 27-1.25-300 MG CAPS, Take 1 capsule by mouth daily before breakfast., Disp: 90 capsule, Rfl: 4   hydrOXYzine  (ATARAX /VISTARIL ) 25 MG tablet, Take 1 tablet (25 mg total) by mouth every 6 (six) hours as needed. (Patient not taking: Reported on 06/19/2022), Disp: 15 tablet, Rfl: 0   metroNIDAZOLE  (FLAGYL ) 500 MG tablet, Take 1 tablet (500 mg total) by mouth 2 (two) times daily. (Patient not taking: Reported on 10/22/2023), Disp: 14 tablet, Rfl: 0   nystatin  cream (MYCOSTATIN ), Apply 1 Application topically 2 (two) times daily. (Patient not taking: Reported on 10/22/2023), Disp: 30 g, Rfl: 1   predniSONE  (DELTASONE ) 10 MG tablet, Take 2 tablets (20 mg total) by mouth 2 (two) times daily. (Patient not taking: Reported on 12/20/2020), Disp: 20 tablet, Rfl: 0 Allergies  Allergen Reactions   Codeine Itching    Social History   Tobacco Use   Smoking status: Former    Current packs/day: 0.00    Types: Cigarettes    Quit date: 06/01/1997    Years since quitting: 26.4    Passive exposure: Never   Smokeless tobacco: Never  Substance Use Topics  Alcohol use: Yes    Alcohol/week: 0.0 standard drinks of alcohol    Comment: occasionally    Family History  Problem Relation Age of Onset   Diabetes Brother    Hypertension Brother    Kidney failure Brother    Breast cancer Neg Hx       Review of Systems  Constitutional: negative for fatigue and weight loss Respiratory: negative for cough and wheezing Cardiovascular: negative for chest pain, fatigue and palpitations Gastrointestinal: negative for abdominal pain and change in bowel habits Musculoskeletal:negative for myalgias Neurological: negative for gait problems and tremors Behavioral/Psych: negative for abusive relationship,  depression Endocrine: negative for temperature intolerance    Genitourinary:  negative for abnormal menstrual periods, genital lesions, hot flashes, sexual problems Integument/breast: negative for breast lump, breast tenderness, nipple discharge and skin lesion(s)    Objective:       BP (!) 170/94   Pulse 64   Ht 5\' 5"  (1.651 m)   Wt 157 lb 6.4 oz (71.4 kg)   LMP 10/08/2023 (Exact Date)   BMI 26.19 kg/m  General:   Alert and no distress  Skin:   no rash or abnormalities  Lungs:   clear to auscultation bilaterally  Heart:   regular rate and rhythm, S1, S2 normal, no murmur, click, rub or gallop  Breasts:   normal without suspicious masses, skin or nipple changes or axillary nodes  Abdomen:  normal findings: no organomegaly, soft, non-tender and no hernia  Pelvis:  External genitalia: normal general appearance Urinary system: urethral meatus normal and bladder without fullness, nontender Vaginal: normal without tenderness, induration or masses Cervix: normal appearance Adnexa: normal bimanual exam Uterus: anteverted and non-tender, normal size   Lab Review Urine pregnancy test Labs reviewed yes Radiologic studies reviewed yes  I have spent a total of 20 minutes of face-to-face time, excluding clinical staff time, reviewing notes and preparing to see patient, ordering tests and/or medications, and counseling the patient.   Assessment:    1. Encounter for gynecological examination with Papanicolaou smear of cervix (Primary) Rx: - Cytology - PAP( [place]) - Ferritin - CBC - Comp Met (CMET) - Hemoglobin A1c - Lipid panel - Prenat-FeFum-DSS-FA-DHA w/o A (PNV-DHA+DOCUSATE) 27-1.25-300 MG CAPS; Take 1 capsule by mouth daily before breakfast.  Dispense: 90 capsule; Refill: 4 - Lipid Panel w/o Chol/HDL Ratio  2. Screen for STD (sexually transmitted disease) Rx: - HIV antibody (with reflex) - RPR - Hepatitis C Antibody - Hepatitis B Surface AntiGEN  3. 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 Hot flashes - treatment options discussed  - declines hormonal therapy    Plan:    Education reviewed: calcium supplements, depression evaluation, low fat, low cholesterol diet, safe sex/STD prevention, self breast exams, and weight bearing exercise. Follow up in: 1 year.   Meds ordered this encounter  Medications   Prenat-FeFum-DSS-FA-DHA w/o A (PNV-DHA+DOCUSATE) 27-1.25-300 MG CAPS    Sig: Take 1 capsule by mouth daily before breakfast.    Dispense:  90 capsule    Refill:  4   Orders Placed This Encounter  Procedures   HIV antibody (with reflex)   RPR   Hepatitis C Antibody   Hepatitis B Surface AntiGEN   Ferritin   CBC   Comp Met (CMET)   Hemoglobin A1c   Lipid panel    Gabrielle Joiner, MD, FACOG Attending Obstetrician & Gynecologist, [facility] for [facility], [facility], [place] 10/22/2023

## 2023-10-23 LAB — COMPREHENSIVE METABOLIC PANEL WITH GFR
ALT: 10 IU/L (ref 0–32)
AST: 15 IU/L (ref 0–40)
Albumin: 4.4 g/dL (ref 3.8–4.9)
Alkaline Phosphatase: 61 IU/L (ref 44–121)
BUN/Creatinine Ratio: 16 (ref 9–23)
BUN: 13 mg/dL (ref 6–24)
Bilirubin Total: 0.2 mg/dL (ref 0.0–1.2)
CO2: 21 mmol/L (ref 20–29)
Calcium: 9.5 mg/dL (ref 8.7–10.2)
Chloride: 102 mmol/L (ref 96–106)
Creatinine, Ser: 0.79 mg/dL (ref 0.57–1.00)
Globulin, Total: 2.7 g/dL (ref 1.5–4.5)
Glucose: 79 mg/dL (ref 70–99)
Potassium: 4.4 mmol/L (ref 3.5–5.2)
Sodium: 138 mmol/L (ref 134–144)
Total Protein: 7.1 g/dL (ref 6.0–8.5)
eGFR: 91 mL/min/{1.73_m2} (ref >59)

## 2023-10-23 LAB — CBC
Hematocrit: 41.1 % (ref 34.0–46.6)
Hemoglobin: 13.4 g/dL (ref 11.1–15.9)
MCH: 27.2 pg (ref 26.6–33.0)
MCHC: 32.6 g/dL (ref 31.5–35.7)
MCV: 84 fL (ref 79–97)
Platelets: 303 10*3/uL (ref 150–450)
RBC: 4.92 x10E6/uL (ref 3.77–5.28)
RDW: 13.7 % (ref 11.7–15.4)
WBC: 4 10*3/uL (ref 3.4–10.8)

## 2023-10-23 LAB — HEMOGLOBIN A1C
Est. average glucose Bld gHb Est-mCnc: 114 mg/dL
Hgb A1c MFr Bld: 5.6 % (ref 4.8–5.6)

## 2023-10-23 LAB — LIPID PANEL W/O CHOL/HDL RATIO
Cholesterol, Total: 183 mg/dL (ref 100–199)
HDL: 83 mg/dL (ref >39)
LDL Chol Calc (NIH): 92 mg/dL (ref 0–99)
Triglycerides: 41 mg/dL (ref 0–149)
VLDL Cholesterol Cal: 8 mg/dL (ref 5–40)

## 2023-10-23 LAB — RPR: RPR Ser Ql: NONREACTIVE

## 2023-10-23 LAB — HIV ANTIBODY (ROUTINE TESTING W REFLEX): HIV Screen 4th Generation wRfx: NONREACTIVE

## 2023-10-23 LAB — FERRITIN: Ferritin: 21 ng/mL (ref 15–150)

## 2023-10-23 LAB — HEPATITIS C ANTIBODY: Hep C Virus Ab: NONREACTIVE

## 2023-10-23 LAB — HEPATITIS B SURFACE ANTIGEN: Hepatitis B Surface Ag: NEGATIVE

## 2023-10-28 LAB — CYTOLOGY - PAP
Comment: NEGATIVE
Diagnosis: NEGATIVE
High risk HPV: NEGATIVE

## 2023-12-28 ENCOUNTER — Encounter: Payer: Self-pay | Admitting: Obstetrics

## 2023-12-29 ENCOUNTER — Ambulatory Visit

## 2023-12-29 ENCOUNTER — Ambulatory Visit: Payer: Self-pay | Admitting: Obstetrics and Gynecology

## 2023-12-29 DIAGNOSIS — R300 Dysuria: Secondary | ICD-10-CM | POA: Diagnosis not present

## 2023-12-29 LAB — POCT URINALYSIS DIPSTICK
Bilirubin, UA: NEGATIVE
Blood, UA: NEGATIVE
Glucose, UA: NEGATIVE
Ketones, UA: NEGATIVE
Nitrite, UA: NEGATIVE
Protein, UA: NEGATIVE
Spec Grav, UA: 1.025 (ref 1.010–1.025)
Urobilinogen, UA: 0.2 U/dL
pH, UA: 6 (ref 5.0–8.0)

## 2023-12-29 NOTE — Progress Notes (Signed)
 SUBJECTIVE: Kayla Maldonado is a 53 y.o. female who complains of urinary frequency, urgency and dysuria x 3 days, without flank pain, fever, chills, or abnormal vaginal discharge or bleeding.   OBJECTIVE: Appears well, in no apparent distress.  Vital signs are normal. Urine dipstick shows positive for leukocytes.    ASSESSMENT: Dysuria  PLAN: Treatment per orders.  Call or return to clinic prn if these symptoms worsen or fail to improve as anticipated.

## 2023-12-31 LAB — URINE CULTURE
